# Patient Record
Sex: Male | Born: 1955 | Hispanic: Yes | Marital: Married | Smoking: Former smoker
Health system: Southern US, Community
[De-identification: ages and names within clinical notes are randomized; demographics above are authoritative.]

## PROBLEM LIST (undated history)

## (undated) DIAGNOSIS — K219 Gastro-esophageal reflux disease without esophagitis: Secondary | ICD-10-CM

## (undated) DIAGNOSIS — F419 Anxiety disorder, unspecified: Secondary | ICD-10-CM

## (undated) DIAGNOSIS — R7303 Prediabetes: Secondary | ICD-10-CM

## (undated) DIAGNOSIS — M1990 Unspecified osteoarthritis, unspecified site: Secondary | ICD-10-CM

## (undated) HISTORY — DX: Unspecified osteoarthritis, unspecified site: M19.90

## (undated) HISTORY — DX: Gastro-esophageal reflux disease without esophagitis: K21.9

## (undated) HISTORY — PX: EYE SURGERY: SHX253

## (undated) HISTORY — PX: OTHER SURGICAL HISTORY: SHX169

## (undated) HISTORY — PX: CHOLECYSTECTOMY: SHX55

---

## 2008-07-07 ENCOUNTER — Emergency Department: Payer: Self-pay | Admitting: Emergency Medicine

## 2009-07-31 ENCOUNTER — Emergency Department: Payer: Self-pay | Admitting: Emergency Medicine

## 2009-08-16 ENCOUNTER — Ambulatory Visit: Payer: Self-pay | Admitting: Gastroenterology

## 2009-08-24 ENCOUNTER — Ambulatory Visit: Payer: Self-pay | Admitting: Surgery

## 2009-08-27 ENCOUNTER — Ambulatory Visit: Payer: Self-pay | Admitting: Surgery

## 2017-09-06 ENCOUNTER — Encounter: Payer: Self-pay | Admitting: Primary Care

## 2017-09-07 ENCOUNTER — Encounter: Payer: Self-pay | Admitting: Gastroenterology

## 2017-09-18 ENCOUNTER — Ambulatory Visit (INDEPENDENT_AMBULATORY_CARE_PROVIDER_SITE_OTHER): Payer: Self-pay | Admitting: Urology

## 2017-09-18 ENCOUNTER — Encounter: Payer: Self-pay | Admitting: Urology

## 2017-09-18 VITALS — BP 135/75 | HR 76 | Ht 68.0 in | Wt 165.0 lb

## 2017-09-18 DIAGNOSIS — N400 Enlarged prostate without lower urinary tract symptoms: Secondary | ICD-10-CM

## 2017-09-18 LAB — URINALYSIS, COMPLETE
BILIRUBIN UA: NEGATIVE
GLUCOSE, UA: NEGATIVE
Ketones, UA: NEGATIVE
Leukocytes, UA: NEGATIVE
Nitrite, UA: NEGATIVE
Protein, UA: NEGATIVE
RBC, UA: NEGATIVE
Specific Gravity, UA: 1.02 (ref 1.005–1.030)
UUROB: 0.2 mg/dL (ref 0.2–1.0)
pH, UA: 6 (ref 5.0–7.5)

## 2017-09-18 LAB — BLADDER SCAN AMB NON-IMAGING: Scan Result: 14

## 2017-09-18 NOTE — Progress Notes (Signed)
09/18/2017 12:34 PM   Troy Juarez 14-Feb-1955 [id number]  Referring provider: Sandrea Hughsubio, Jessica, NP [address] [place], [place] [address]  CC: Severe lower urinary tract symptoms  HPI: I am seeing Troy Juarez in urology clinic today in consultation for severe lower urinary tract symptoms. frfrom his nurse practitioner Sandrea HughsJessica Rubio.  He is a healthy 62 year old male who reports a 3 to 4-year history of worsening urinary symptoms.  He specifically reports frequency every 30 to 40 minutes, weak stream, feeling of incomplete emptying, and nocturia 6-8 times per night.  He has tried tamsulosin and finasteride for over 6 months with no improvement at all.  He denies any gross hematuria, urinary tract infections, or history of urinary retention.  He also denies family history of prostate cancer.  The entirety of today's visit was conducted through a Engineer, structuralpanish translator.  IPSS score 24 (severe).  PVR in clinic today is 45 cc  PSA 0.9   PMH: Past Medical History:  Diagnosis Date  . Arthritis   . GERD (gastroesophageal reflux disease)     Surgical History: History reviewed. No pertinent surgical history.  Home Medications:  Allergies as of 09/18/2017   Not on File     Medication List        Accurate as of 09/18/17 12:34 PM. Always use your most recent med list.          finasteride 5 MG tablet Commonly known as:  PROSCAR Take 5 mg by mouth daily.   hydrocortisone 2.5 % rectal cream Commonly known as:  ANUSOL-HC Place 1 application rectally 2 (two) times daily.   omeprazole 20 MG capsule Commonly known as:  PRILOSEC Take 20 mg by mouth daily.   tamsulosin 0.4 MG Caps capsule Commonly known as:  FLOMAX Take 0.4 mg by mouth.       Allergies: Not on File  Family History: Family History  Problem Relation Age of Onset  . Prostate cancer Neg Hx   . Bladder Cancer Neg Hx   . Kidney cancer Neg Hx     Social History:  reports that he has quit  smoking. He has never used smokeless tobacco. He reports that he drinks alcohol. He reports that he has current or past drug history.  ROS: Please see flowsheet from today's date for complete review of systems.  Physical Exam: BP 135/75 (BP Location: Left Arm, Patient Position: Sitting, Cuff Size: Normal)   Pulse 76   Ht 5\' 8"  (1.727 m)   Wt 165 lb (74.8 kg)   BMI 25.09 kg/m   Constitutional:  Alert and oriented, No acute distress. Cardiovascular: No clubbing, cyanosis, or edema. Respiratory: Normal respiratory effort, no increased work of breathing. GI: Abdomen is soft, nontender, nondistended, no abdominal masses GU: No CVA tenderness, uncircumcised phallus without lesions, widely patent meatus. Atrophic left testicle. DRE: ~50g gland, smooth, no nodules Lymph: No cervical or inguinal lymphadenopathy. Skin: No rashes, bruises or suspicious lesions. Neurologic: Grossly intact, no focal deficits, moving all 4 extremities. Psychiatric: Normal mood and affect.  Laboratory Data: 09-06-2017 WBC 7.6 Hct 43 Plt 381 sCr 0.88  PSA 0.9  Urinalysis negative, no RBCS, WBCs, or bacteria  Pertinent Imaging: None to review  Assessment & Plan:   In summary, Troy Juarez is a 62 year old healthy Spanish-speaking male who presents with severe lower urinary tract symptoms refractory to maximal medical management.  His PSA is normal at 0.9, and he has normal renal function with a creatinine of 0.88.  IPSS  score is 24.  We had a long conversation about his lower urinary tract symptoms and BPH.  We specifically discussed that there are multiple surgical options for treatment however the best option can depend on prostate size and shape, as well as patient preference.  We will plan to set him up for a joint cystoscopy and transrectal ultrasound to evaluate prostate size and shape, as well as rule out urethral stricture, in the coming weeks.  Pending the results of the studies we will further discuss  options for bladder outlet management including transurethral resection of the prostate, holmium laser enucleation of the prostate, TUIP, and UroLift.  1. Benign prostatic hyperplasia, unspecified whether lower urinary tract symptoms present -Cystoscopy and transrectal ultrasound of prostate next available, and discuss bladder outlet surgeries   Return in about 2 weeks (around 10/02/2017) for Cystoscopy and transrectal ultrasound.  Sondra ComeBrian C Tawania Daponte, MD  [facility] [address], [address] [place], [place] [address] [phone]

## 2017-09-18 NOTE — Patient Instructions (Signed)
Hiperplasia prosttica benigna Benign Prostatic Hyperplasia La hiperplasia prosttica benigna (HBP) es un aumento del tamao de la prstata que es causado por el proceso de envejecimiento normal y no por el cncer. La prstata es una glndula del tamao de una nuez que participa en la produccin de semen. Est ubicada frente al recto y debajo de la vejiga. La vejiga almacena orina, y la uretra es el tubo que transporta la orina desde la vejiga hasta el exterior del cuerpo. La prstata puede aumentar de tamao a medida que un hombre envejece. Una prstata agrandada puede presionar la uretra. Esto puede dificultar el pasaje de la orina. La acumulacin de orina en la vejiga puede causar una infeccin. La presin y la infeccin pueden provocar daos en la vejiga y una insuficiencia en los riones (renal). Cules son las causas? Esta afeccin es una parte normal del proceso de envejecimiento. Sin embargo, no todos los hombres desarrollan problemas por esta afeccin. Si la prstata se agranda lejos de la uretra, el flujo de orina no se obstruir. Si se agranda hacia la uretra y la comprime, habr problemas con el paso de la orina. Qu incrementa el riesgo? Es ms probable que esta afeccin se manifieste en hombres mayores de 50aos. Cules son los signos o los sntomas? Los sntomas de esta afeccin incluyen los siguientes:  Levantarse con frecuencia durante la noche para orinar.  Necesidad de orinar con ms frecuencia durante el da.  Dificultad para comenzar a eliminar la orina.  Disminucin del tamao y de la fuerza del chorro de orina.  Prdida (goteo) luego de orinar.  Imposibilidad para orinar. En este caso, es necesario un tratamiento inmediato.  Imposibilidad para vaciar la vejiga completamente.  Dolor al orinar. Esto es ms comn si tambin hay una infeccin.  Infeccin urinaria (IU).  Cmo se diagnostica? Esta afeccin se diagnostica en funcin de los antecedentes mdicos, un  examen fsico y los sntomas. Tambin se le realizarn estudios, por ejemplo:  Un estudio despus de vaciar la vejiga. Este mide la cantidad de orina que queda en la vejiga despus de terminar de orinar.  Examen rectal digital. En un examen rectal, el mdico controla la prstata colocando un dedo lubricado y enguantado en el recto para sentir la parte posterior de la prstata. Este examen detecta el tamao de la glndula y si hay algn bulto o crecimiento anormal.  Anlisis de orina (urinlisis).  Estudio de antgeno prosttico especfico (PSA). Este es un anlisis de sangre que se utiliza para diagnosticar el cncer de prstata.  Una ecografa. En este estudio, se utilizan ondas de sonido para producir de manera electrnica una imagen de la glndula prosttica.  El mdico puede derivarlo a un especialista en enfermedades del rin y de la prstata (urlogo). Cmo se trata? Una vez que los sntomas comienzan, el mdico controlar la afeccin (vigilancia activa u observacin cautelosa). El tratamiento depender de la gravedad de la afeccin. El tratamiento puede incluir lo siguiente:  Observacin y exmenes anuales. Este puede ser el nico tratamiento necesario si la afeccin y los sntomas son leves.  Medicamentos para aliviar los sntomas, entre los que se incluyen los siguientes: ? Medicamentos para achicar la prstata. ? Medicamentos para relajar el msculo de la prstata.  Ciruga, solo cuando los casos son graves. La ciruga puede incluir lo siguiente: ? Prostatectoma. En este procedimiento, se extrae el tejido de la prstata completamente a travs de una incisin abierta, con un laparoscopio o con robtica. ? Reseccin transuretral de la prstata (  RTUP). En este procedimiento, se inserta una herramienta a travs de la abertura en la punta del pene (uretra). Se utiliza para cortar tejido del centro interior de la prstata. Los trozos se retiran a travs de la misma abertura del pene.  De este modo, se libera la obstruccin. ? Incisin transuretral (ITUP). En este procedimiento, se hacen pequeos cortes en la prstata. Esto alivia la presin de la prstata sobre la uretra. ? Termoterapia transuretral con microondas (TTUM). En este procedimiento, se utilizan microondas para generar calor. El calor destruye y extirpa una pequea cantidad de tejido prosttico. ? Ablacin transuretral con aguja (ATUA). En este procedimiento, se utiliza la radiofrecuencia para destruir y extirpar una pequea cantidad de tejido prosttico. ? Coagulacin intersticial con lser (CIL). En este procedimiento, se utiliza un lser para destruir y extirpar una pequea cantidad de tejido prosttico. ? Electrovaporizacin transuretral (EVTU). En este procedimiento, se utilizan electrodos para destruir y extirpar una pequea cantidad de tejido prosttico. ? Liberacin uretral prosttica. En este procedimiento, se inserta un implante para ejercer presin en los lbulos de la prstata, en direccin contraria a la uretra.  Siga estas indicaciones en su casa:  Tome los medicamentos de venta libre y los recetados solamente como se lo haya indicado el mdico.  Controle si hay cambios en los sntomas. Hable con su mdico antes de hacer cualquier cambio.  Evite beber grandes cantidades de lquido antes de irse a la cama o de salir de su casa.  Evite o reduzca la cantidad de cafena o alcohol que consume.  Tmese tiempo para orinar.  Concurra a todas las visitas de seguimiento como se lo haya indicado el mdico. Esto es importante. Comunquese con un mdico si:  Siente dolor en la espalda sin explicacin.  Los sntomas no mejoran con el tratamiento.  Los medicamentos le causan efectos secundarios.  La orina se vuelve muy oscura o tiene mal olor.  Siente que la parte inferior del abdomen est distendida y tiene problemas para orinar. Solicite ayuda de inmediato si:  Tiene fiebre o siente escalofros.  De  repente no puede orinar.  Se siente mareado, ligeramente aturdido o se desmaya.  Observa gran cantidad de sangre o cogulos sanguneos en la orina.  Sus problemas urinarios se vuelven difciles de controlar.  Siente dolor moderado a intenso en la espalda o en la fosa lumbar. La fosa lumbar es el costado del cuerpo entre las costillas y la cadera. Estos sntomas pueden representar un problema grave que constituye una emergencia. No espere hasta que los sntomas desaparezcan. Solicite atencin mdica de inmediato. Comunquese con el servicio de emergencias de su localidad (911 en los [place]). No conduzca por sus propios medios hasta el hospital. Resumen  La hiperplasia prosttica benigna (HBP) es un aumento del tamao de la prstata que es causado por el proceso de envejecimiento normal y no por el cncer.  Una prstata agrandada puede presionar la uretra. Esto puede dificultar el paso de la orina.  Esta afeccin es parte del proceso de envejecimiento normal y es ms probable que se manifieste en hombres mayores de 50aos.  Obtenga atencin de inmediato si de repente no puede orinar. Esta informacin no tiene como fin reemplazar el consejo del mdico. Asegrese de hacerle al mdico cualquier pregunta que tenga. Document Released: 01/23/2005 Document Revised: 05/15/2016 Document Reviewed: 05/15/2016 Elsevier Interactive Patient Education  2018 Elsevier Inc.  

## 2017-10-02 ENCOUNTER — Ambulatory Visit (INDEPENDENT_AMBULATORY_CARE_PROVIDER_SITE_OTHER): Payer: No Typology Code available for payment source | Admitting: Urology

## 2017-10-02 ENCOUNTER — Encounter: Payer: Self-pay | Admitting: Urology

## 2017-10-02 VITALS — BP 129/69 | HR 62 | Ht 68.0 in | Wt 172.3 lb

## 2017-10-02 DIAGNOSIS — N400 Enlarged prostate without lower urinary tract symptoms: Secondary | ICD-10-CM | POA: Diagnosis not present

## 2017-10-02 LAB — URINALYSIS, COMPLETE
Bilirubin, UA: NEGATIVE
Glucose, UA: NEGATIVE
Ketones, UA: NEGATIVE
Leukocytes, UA: NEGATIVE
Nitrite, UA: NEGATIVE
RBC UA: NEGATIVE
Specific Gravity, UA: 1.02 (ref 1.005–1.030)
UUROB: 0.2 mg/dL (ref 0.2–1.0)
pH, UA: 8.5 — ABNORMAL HIGH (ref 5.0–7.5)

## 2017-10-02 LAB — MICROSCOPIC EXAMINATION
BACTERIA UA: NONE SEEN
Epithelial Cells (non renal): NONE SEEN /hpf (ref 0–10)
RBC MICROSCOPIC, UA: NONE SEEN /HPF (ref 0–2)
WBC, UA: NONE SEEN /hpf (ref 0–5)

## 2017-10-02 MED ORDER — LEVOFLOXACIN 500 MG PO TABS
500.0000 mg | ORAL_TABLET | Freq: Once | ORAL | Status: AC
  Administered 2017-10-02: 500 mg via ORAL

## 2017-10-02 MED ORDER — OXYBUTYNIN CHLORIDE ER 10 MG PO TB24: 10.0000 mg | ORAL_TABLET | Freq: Every day | ORAL | 2 refills | Status: DC

## 2017-10-02 MED ORDER — GENTAMICIN SULFATE 40 MG/ML IJ SOLN
80.0000 mg | Freq: Once | INTRAMUSCULAR | Status: AC
  Administered 2017-10-02: 80 mg via INTRAMUSCULAR

## 2017-10-02 NOTE — Progress Notes (Signed)
Cystoscopy Procedure Note:  Indication: Evaluate for stricture/prostate anatomy  After informed consent and discussion of the procedure and its risks, Troy Juarez was positioned and prepped in the standard fashion. Cystoscopy was performed with the a flexible cystoscope. The urethra, bladder neck and entire bladder was visualized in a standard fashion. The prostate was short with a high bladder neck. No median lobe on retroflexion.  Transrectal ultrasound: Ultrasound probe inserted into the rectum, prostate visualized. No hypoechoic areas. No median lobe. Decompressed bladder. Volume 32cc.   Findings: 32cc prostate with high bladder neck, no median lobe. No urethral abnormalities.  Assessment and Plan: We discussed at length BPH and his urinary symptoms.  Though he has mixed symptoms, I suspect his primary issue is a high bladder neck with obstruction.  He would like to proceed with transurethral resection of the prostate. We discussed the risks and benefits, including bleeding, infection, and retrograde ejaculation.  We will trial a month of Ditropan for possible overactive bladder.  -Schedule bipolar TURP in 4-6 weeks -Trial of ditropan XL, proceed with TURP if no improvement in symptoms, cancel surgery if urinary symptoms significantly improved on ditropan  Legrand RamsBrian Sninsky, MD

## 2017-10-05 ENCOUNTER — Other Ambulatory Visit: Payer: Self-pay | Admitting: Radiology

## 2017-10-05 DIAGNOSIS — N400 Enlarged prostate without lower urinary tract symptoms: Secondary | ICD-10-CM

## 2017-10-09 ENCOUNTER — Telehealth: Payer: Self-pay | Admitting: Radiology

## 2017-10-09 NOTE — Telephone Encounter (Signed)
Daughter states patient has been taking ditropan XL for one week with no improvement in symptoms at this time. Advised daughter that surgical clearance is required from patient's PCP & they will contact patient if an appointment with them is needed prior to clearance. Questions answered. Daughter voices understanding.

## 2017-10-30 ENCOUNTER — Other Ambulatory Visit: Payer: Self-pay

## 2017-11-02 ENCOUNTER — Other Ambulatory Visit: Payer: Self-pay

## 2017-11-02 ENCOUNTER — Encounter
Admission: RE | Admit: 2017-11-02 | Discharge: 2017-11-02 | Disposition: A | Discharge status: Home / Self Care | Payer: PRIVATE HEALTH INSURANCE | Source: Ambulatory Visit | Attending: Urology | Admitting: Urology

## 2017-11-02 DIAGNOSIS — E119 Type 2 diabetes mellitus without complications: Secondary | ICD-10-CM | POA: Diagnosis not present

## 2017-11-02 DIAGNOSIS — Z01810 Encounter for preprocedural cardiovascular examination: Secondary | ICD-10-CM | POA: Diagnosis present

## 2017-11-02 DIAGNOSIS — Z01812 Encounter for preprocedural laboratory examination: Secondary | ICD-10-CM | POA: Diagnosis present

## 2017-11-02 HISTORY — DX: Prediabetes: R73.03

## 2017-11-02 HISTORY — DX: Anxiety disorder, unspecified: F41.9

## 2017-11-02 LAB — CBC
HEMATOCRIT: 42.7 % (ref 40.0–52.0)
Hemoglobin: 15.1 g/dL (ref 13.0–18.0)
MCH: 32.8 pg (ref 26.0–34.0)
MCHC: 35.2 g/dL (ref 32.0–36.0)
MCV: 93.1 fL (ref 80.0–100.0)
PLATELETS: 311 10*3/uL (ref 150–440)
RBC: 4.59 MIL/uL (ref 4.40–5.90)
RDW: 13.5 % (ref 11.5–14.5)
WBC: 7.7 10*3/uL (ref 3.8–10.6)

## 2017-11-02 LAB — BASIC METABOLIC PANEL
Anion gap: 8 (ref 5–15)
BUN: 18 mg/dL (ref 8–23)
CHLORIDE: 106 mmol/L (ref 98–111)
CO2: 26 mmol/L (ref 22–32)
CREATININE: 0.88 mg/dL (ref 0.61–1.24)
Calcium: 9.2 mg/dL (ref 8.9–10.3)
GFR calc Af Amer: >60 mL/min (ref >60)
GFR calc non Af Amer: >60 mL/min (ref >60)
Glucose, Bld: 123 mg/dL — ABNORMAL HIGH (ref 70–99)
POTASSIUM: 4 mmol/L (ref 3.5–5.1)
SODIUM: 140 mmol/L (ref 135–145)

## 2017-11-02 NOTE — Patient Instructions (Addendum)
Your procedure is scheduled on: 11/09/2017 Su procedimiento est programado para: Report to 2ND FLOOR MEDICAL MALL ENTRANCE SURGERY DESK Augustin Coupe a: To find out your arrival time please call [phone] between 1PM - 3PM on 11/08/2017. Para saber su hora de llegada por favor llame al [phone] entre la 1PM - 3PM el da:  Remember: Instructions that are not followed completely may result in serious medical risk, up to and including death, or upon the discretion of your surgeon and anesthesiologist your surgery may need to be rescheduled.  Recuerde: Las instrucciones que no se siguen completamente Armed forces logistics/support/administrative officer en un riesgo de salud grave, incluyendo hasta la [place] o a discrecin de su cirujano y Scientific laboratory technician, su ciruga se puede posponer.   __X__ 1. Do not eat food or drink liquids after midnight. No gum chewing or hard candies.  No coma alimentos ni tome lquidos despus de la medianoche.  No mastique chicle ni caramelos  duros.     __X__ 2. No alcohol for 24 hours before or after surgery.    No tome alcohol durante las 24 horas antes ni despus de la [place].   ____ 3. Bring all medications with you on the day of surgery if instructed.    Lleve todos los medicamentos con usted el da de su ciruga si se le ha indicado as.   __X__ 4. Notify your doctor if there is any change in your medical condition (cold, fever,                             infections).    Informe a su mdico si hay algn cambio en su condicin mdica (resfriado, fiebre, infecciones).   Do not wear jewelry, make-up, hairpins, clips or nail polish.  No use joyas, maquillajes, pinzas/ganchos para el cabello ni esmalte de uas.  Do not wear lotions, powders, or perfumes. .  No use lociones, polvos o perfumes.  .    Do not shave 48 hours prior to surgery. Men may shave face and neck.  No se afeite 48 horas antes de la [place].  Los hombres pueden [employer] cara y el cuello.   Do not bring valuables to the  hospital.   No lleve objetos de valor al hospital.  [facility] is not responsible for any belongings or valuables.   no se hace responsable de ningn tipo de pertenencias u objetos de Licensed conveyancer.               Contacts, dentures or bridgework may not be worn into surgery.  Los lentes de [place], las dentaduras postizas o puentes no se pueden usar en la [place].  Leave your suitcase in the car. After surgery it may be brought to your room.  Deje su maleta en el auto.  Despus de la ciruga podr traerla a su habitacin.  For patients admitted to the hospital, discharge time is determined by your treatment team.  Para los pacientes que sean ingresados al hospital, el tiempo en el cual se le dar de alta es determinado por su                equipo de [place].   Patients discharged the day of surgery will not be allowed to drive home. A los pacientes que se les da de alta el mismo da de la ciruga no se les permitir conducir a Higher education careers adviser.   Please read over the following fact sheets that you were  given: Por favor lea las siguientes hojas de informacin que le dieron:   MRSA Information   __X__ Take these medicines the morning of surgery with A SIP OF WATER:          [employer] medicinas la maana de la ciruga con UN SORBO DE AGUA:  1. omeprazole (PRILOSEC)  2.   3.   4.       5.  6.  ____ Fleet Enema (as directed)          Enema de Fleet (segn lo indicado)    ____ Use CHG Soap as directed          Utilice el jabn de CHG segn lo indicado  ____ Use inhalers on the day of surgery          Use los inhaladores el da de la ciruga  ____ Stop metformin 2 days prior to surgery          Deje de tomar el metformin 2 das antes de la ciruga    ____ Take 1/2 of usual insulin dose the night before surgery and none on the morning of surgery           Tome la mitad de la dosis habitual de insulina la noche antes de la [place] y no tome nada en la maana de la              ciruga  ____ Stop Coumadin/Plavix/aspirin on           Deje de tomar el Coumadin/Plavix/aspirina el da:  __X__ Stop Anti-inflammatories on TODAY NO IBUPROFEN UNTIL AFTER SURGERY MAY TAKE TYLENOL IF NEEDED          Deje de tomar antiinflamatorios el da:   ____ Stop supplements until after surgery            Deje de tomar suplementos hasta despus de la ciruga  ____ Bring C-Pap to the hospital          Lleve el C-Pap al hospital

## 2017-11-08 MED ORDER — CIPROFLOXACIN IN D5W 400 MG/200ML IV SOLN
400.0000 mg | INTRAVENOUS | Status: AC
  Administered 2017-11-09: 400 mg via INTRAVENOUS

## 2017-11-09 ENCOUNTER — Ambulatory Visit: Payer: PRIVATE HEALTH INSURANCE | Admitting: Family

## 2017-11-09 ENCOUNTER — Observation Stay
Admission: RE | Admit: 2017-11-09 | Discharge: 2017-11-10 | Disposition: A | Discharge status: Home / Self Care | Payer: PRIVATE HEALTH INSURANCE | Source: Ambulatory Visit | Attending: Urology | Admitting: Urology

## 2017-11-09 ENCOUNTER — Encounter
Admission: RE | Disposition: A | Discharge status: Home / Self Care | Payer: Self-pay | Source: Ambulatory Visit | Attending: Urology

## 2017-11-09 ENCOUNTER — Other Ambulatory Visit: Payer: Self-pay

## 2017-11-09 DIAGNOSIS — R3911 Hesitancy of micturition: Secondary | ICD-10-CM | POA: Diagnosis not present

## 2017-11-09 DIAGNOSIS — N400 Enlarged prostate without lower urinary tract symptoms: Secondary | ICD-10-CM | POA: Diagnosis present

## 2017-11-09 DIAGNOSIS — Z23 Encounter for immunization: Secondary | ICD-10-CM | POA: Insufficient documentation

## 2017-11-09 DIAGNOSIS — N401 Enlarged prostate with lower urinary tract symptoms: Principal | ICD-10-CM | POA: Insufficient documentation

## 2017-11-09 DIAGNOSIS — M1990 Unspecified osteoarthritis, unspecified site: Secondary | ICD-10-CM | POA: Insufficient documentation

## 2017-11-09 DIAGNOSIS — F419 Anxiety disorder, unspecified: Secondary | ICD-10-CM | POA: Diagnosis not present

## 2017-11-09 DIAGNOSIS — R7303 Prediabetes: Secondary | ICD-10-CM | POA: Insufficient documentation

## 2017-11-09 DIAGNOSIS — K219 Gastro-esophageal reflux disease without esophagitis: Secondary | ICD-10-CM | POA: Diagnosis not present

## 2017-11-09 DIAGNOSIS — R350 Frequency of micturition: Secondary | ICD-10-CM | POA: Diagnosis not present

## 2017-11-09 DIAGNOSIS — Z87891 Personal history of nicotine dependence: Secondary | ICD-10-CM | POA: Diagnosis not present

## 2017-11-09 DIAGNOSIS — R3915 Urgency of urination: Secondary | ICD-10-CM | POA: Insufficient documentation

## 2017-11-09 DIAGNOSIS — R3912 Poor urinary stream: Secondary | ICD-10-CM | POA: Diagnosis not present

## 2017-11-09 DIAGNOSIS — Z79899 Other long term (current) drug therapy: Secondary | ICD-10-CM | POA: Diagnosis not present

## 2017-11-09 HISTORY — PX: TRANSURETHRAL RESECTION OF PROSTATE: SHX73

## 2017-11-09 SURGERY — TURP (TRANSURETHRAL RESECTION OF PROSTATE)
Anesthesia: General | Site: Prostate

## 2017-11-09 MED ORDER — FAMOTIDINE 20 MG PO TABS
ORAL_TABLET | ORAL | Status: AC
  Administered 2017-11-09: 20 mg
  Filled 2017-11-09: qty 1

## 2017-11-09 MED ORDER — SUGAMMADEX SODIUM 200 MG/2ML IV SOLN
INTRAVENOUS | Status: DC | PRN
  Administered 2017-11-09: 100 mg via INTRAVENOUS

## 2017-11-09 MED ORDER — PANTOPRAZOLE SODIUM 40 MG PO TBEC
40.0000 mg | DELAYED_RELEASE_TABLET | Freq: Every day | ORAL | Status: DC
  Administered 2017-11-09 – 2017-11-10 (×2): 40 mg via ORAL
  Filled 2017-11-09 (×2): qty 1

## 2017-11-09 MED ORDER — ONDANSETRON HCL 4 MG/2ML IJ SOLN: 4.0000 mg | INTRAMUSCULAR | Status: DC | PRN

## 2017-11-09 MED ORDER — MIDAZOLAM HCL 2 MG/2ML IJ SOLN
INTRAMUSCULAR | Status: AC
  Filled 2017-11-09: qty 2

## 2017-11-09 MED ORDER — PROMETHAZINE HCL 25 MG/ML IJ SOLN: 6.2500 mg | INTRAMUSCULAR | Status: DC | PRN

## 2017-11-09 MED ORDER — HYDROCODONE-ACETAMINOPHEN 5-325 MG PO TABS: 1.0000 | ORAL_TABLET | ORAL | 0 refills | Status: AC | PRN

## 2017-11-09 MED ORDER — PROPOFOL 10 MG/ML IV BOLUS
INTRAVENOUS | Status: DC | PRN
  Administered 2017-11-09: 130 mg via INTRAVENOUS

## 2017-11-09 MED ORDER — SENNOSIDES-DOCUSATE SODIUM 8.6-50 MG PO TABS
2.0000 | ORAL_TABLET | Freq: Every day | ORAL | Status: DC
  Administered 2017-11-09: 2 via ORAL
  Filled 2017-11-09: qty 2

## 2017-11-09 MED ORDER — LIDOCAINE HCL (CARDIAC) PF 100 MG/5ML IV SOSY
PREFILLED_SYRINGE | INTRAVENOUS | Status: DC | PRN
  Administered 2017-11-09: 30 mg via INTRAVENOUS

## 2017-11-09 MED ORDER — HYDROMORPHONE HCL 1 MG/ML IJ SOLN: 0.5000 mg | INTRAMUSCULAR | Status: DC | PRN

## 2017-11-09 MED ORDER — LACTATED RINGERS IV SOLN
INTRAVENOUS | Status: DC
  Administered 2017-11-09: 12:00:00 via INTRAVENOUS

## 2017-11-09 MED ORDER — INFLUENZA VAC SPLIT QUAD 0.5 ML IM SUSY
0.5000 mL | PREFILLED_SYRINGE | INTRAMUSCULAR | Status: AC
  Administered 2017-11-10: 0.5 mL via INTRAMUSCULAR
  Filled 2017-11-09: qty 0.5

## 2017-11-09 MED ORDER — DEXAMETHASONE SODIUM PHOSPHATE 10 MG/ML IJ SOLN
INTRAMUSCULAR | Status: DC | PRN
  Administered 2017-11-09: 10 mg via INTRAVENOUS

## 2017-11-09 MED ORDER — OXYCODONE HCL 5 MG PO TABS: 5.0000 mg | ORAL_TABLET | Freq: Once | ORAL | Status: DC | PRN

## 2017-11-09 MED ORDER — ROCURONIUM BROMIDE 100 MG/10ML IV SOLN
INTRAVENOUS | Status: DC | PRN
  Administered 2017-11-09: 50 mg via INTRAVENOUS

## 2017-11-09 MED ORDER — HYDROCORTISONE 2.5 % RE CREA
1.0000 "application " | TOPICAL_CREAM | Freq: Two times a day (BID) | RECTAL | Status: DC
  Administered 2017-11-09 – 2017-11-10 (×2): 1 via RECTAL
  Filled 2017-11-09: qty 28.35

## 2017-11-09 MED ORDER — CIPROFLOXACIN IN D5W 400 MG/200ML IV SOLN
INTRAVENOUS | Status: AC
  Filled 2017-11-09: qty 200

## 2017-11-09 MED ORDER — ACETAMINOPHEN 325 MG PO TABS: 650.0000 mg | ORAL_TABLET | ORAL | Status: DC | PRN

## 2017-11-09 MED ORDER — FENTANYL CITRATE (PF) 100 MCG/2ML IJ SOLN: 25.0000 ug | INTRAMUSCULAR | Status: DC | PRN

## 2017-11-09 MED ORDER — HYDROCODONE-ACETAMINOPHEN 5-325 MG PO TABS
1.0000 | ORAL_TABLET | ORAL | Status: DC | PRN
  Administered 2017-11-09: 1 via ORAL
  Administered 2017-11-09: 2 via ORAL
  Administered 2017-11-10: 1 via ORAL
  Filled 2017-11-09: qty 2
  Filled 2017-11-09 (×2): qty 1

## 2017-11-09 MED ORDER — PROPOFOL 500 MG/50ML IV EMUL
INTRAVENOUS | Status: AC
  Filled 2017-11-09: qty 100

## 2017-11-09 MED ORDER — FENTANYL CITRATE (PF) 100 MCG/2ML IJ SOLN
INTRAMUSCULAR | Status: AC
  Filled 2017-11-09: qty 2

## 2017-11-09 MED ORDER — MEPERIDINE HCL 50 MG/ML IJ SOLN: 6.2500 mg | INTRAMUSCULAR | Status: DC | PRN

## 2017-11-09 MED ORDER — OXYCODONE HCL 5 MG/5ML PO SOLN: 5.0000 mg | Freq: Once | ORAL | Status: DC | PRN

## 2017-11-09 MED ORDER — SODIUM CHLORIDE 0.9 % IR SOLN: 3000.0000 mL | Status: DC

## 2017-11-09 MED ORDER — MIDAZOLAM HCL 2 MG/2ML IJ SOLN
INTRAMUSCULAR | Status: DC | PRN
  Administered 2017-11-09: 2 mg via INTRAVENOUS

## 2017-11-09 MED ORDER — BELLADONNA ALKALOIDS-OPIUM 16.2-60 MG RE SUPP: 1.0000 | Freq: Four times a day (QID) | RECTAL | Status: DC | PRN

## 2017-11-09 SURGICAL SUPPLY — 21 items
ADAPTER IRRIG TUBE 2 SPIKE SOL (ADAPTER) IMPLANT
BAG DRAIN CYSTO-URO LG1000N (MISCELLANEOUS) ×2 IMPLANT
BAG URO DRAIN 4000ML (MISCELLANEOUS) ×2 IMPLANT
CATH FOLEY 3WAY 30CC 22FR (CATHETERS) ×2 IMPLANT
DRAPE UTILITY 15X26 TOWEL STRL (DRAPES) ×2 IMPLANT
ELECT LOOP 22F BIPOLAR SML (ELECTROSURGICAL)
ELECTRODE LOOP 22F BIPOLAR SML (ELECTROSURGICAL) IMPLANT
GLOVE BIO SURGEON STRL SZ7.5 (GLOVE) ×2 IMPLANT
GOWN STRL REUS W/ TWL LRG LVL3 (GOWN DISPOSABLE) ×1 IMPLANT
GOWN STRL REUS W/ TWL XL LVL3 (GOWN DISPOSABLE) ×1 IMPLANT
GOWN STRL REUS W/TWL LRG LVL3 (GOWN DISPOSABLE) ×1
GOWN STRL REUS W/TWL XL LVL3 (GOWN DISPOSABLE) ×1
HOLDER FOLEY CATH W/STRAP (MISCELLANEOUS) ×4 IMPLANT
KIT TURNOVER CYSTO (KITS) ×2 IMPLANT
LOOP CUT BIPOLAR 24F LRG (ELECTROSURGICAL) ×2 IMPLANT
PACK CYSTO AR (MISCELLANEOUS) ×2 IMPLANT
SET IRRIG Y TYPE TUR BLADDER L (SET/KITS/TRAYS/PACK) ×2 IMPLANT
SOL .9 NS 3000ML IRR  AL (IV SOLUTION) ×6
SOL .9 NS 3000ML IRR UROMATIC (IV SOLUTION) ×6 IMPLANT
SYRINGE IRR TOOMEY STRL 70CC (SYRINGE) ×2 IMPLANT
WATER STERILE IRR 1000ML POUR (IV SOLUTION) ×2 IMPLANT

## 2017-11-09 NOTE — Anesthesia Postprocedure Evaluation (Signed)
Anesthesia Post Note  Patient: Troy Juarez  Procedure(s) Performed: Bipolar TRANSURETHRAL RESECTION OF THE PROSTATE (TURP) (N/A Prostate)  Patient location during evaluation: PACU Anesthesia Type: General Level of consciousness: awake and alert and oriented Pain management: pain level controlled Vital Signs Assessment: post-procedure vital signs reviewed and stable Respiratory status: spontaneous breathing, nonlabored ventilation and respiratory function stable Cardiovascular status: blood pressure returned to baseline and stable Postop Assessment: no signs of nausea or vomiting Anesthetic complications: no     Last Vitals:  Vitals:   11/09/17 1453 11/09/17 1508  BP: 131/86 126/72  Pulse: 82 68  Resp: 17 14  Temp:    SpO2: 93% 93%    Last Pain:  Vitals:   11/09/17 1508  TempSrc:   PainSc: 0-No pain                 Annye Forrey

## 2017-11-09 NOTE — Anesthesia Preprocedure Evaluation (Signed)
Anesthesia Evaluation  Patient identified by MRN, date of birth, ID band Patient awake    Reviewed: Allergy & Precautions, NPO status , Patient's Chart, lab work & pertinent test results  History of Anesthesia Complications Negative for: history of anesthetic complications  Airway Mallampati: III  TM Distance: >3 FB Neck ROM: Full    Dental  (+) Poor Dentition   Pulmonary neg sleep apnea, neg COPD, former smoker,    breath sounds clear to auscultation- rhonchi (-) wheezing      Cardiovascular Exercise Tolerance: Good (-) hypertension(-) CAD, (-) Past MI, (-) Cardiac Stents and (-) CABG  Rhythm:Regular Rate:Normal - Systolic murmurs and - Diastolic murmurs    Neuro/Psych Anxiety negative neurological ROS     GI/Hepatic Neg liver ROS, GERD  ,  Endo/Other  negative endocrine ROSneg diabetes  Renal/GU negative Renal ROS     Musculoskeletal  (+) Arthritis ,   Abdominal (+) - obese,   Peds  Hematology negative hematology ROS (+)   Anesthesia Other Findings Past Medical History: No date: Anxiety No date: Arthritis No date: GERD (gastroesophageal reflux disease) No date: Pre-diabetes   Reproductive/Obstetrics                             Anesthesia Physical Anesthesia Plan  ASA: II  Anesthesia Plan: General   Post-op Pain Management:    Induction: Intravenous  PONV Risk Score and Plan: 1 and Ondansetron, Dexamethasone and Midazolam  Airway Management Planned: Oral ETT  Additional Equipment:   Intra-op Plan:   Post-operative Plan: Extubation in OR  Informed Consent: I have reviewed the patients History and Physical, chart, labs and discussed the procedure including the risks, benefits and alternatives for the proposed anesthesia with the patient or authorized representative who has indicated his/her understanding and acceptance.   Dental advisory given (consent obtained with  interpreter)  Plan Discussed with: CRNA and Anesthesiologist  Anesthesia Plan Comments:         Anesthesia Quick Evaluation

## 2017-11-09 NOTE — Transfer of Care (Signed)
Immediate Anesthesia Transfer of Care Note  Patient: Troy Juarez  Procedure(s) Performed: Bipolar TRANSURETHRAL RESECTION OF THE PROSTATE (TURP) (N/A Prostate)  Patient Location: PACU  Anesthesia Type:General  Level of Consciousness: awake, alert  and oriented  Airway & Oxygen Therapy: Patient Spontanous Breathing and Patient connected to face mask oxygen  Post-op Assessment: Report given to RN and Post -op Vital signs reviewed and stable  Post vital signs: Reviewed and stable  Last Vitals:  Vitals Value Taken Time  BP 132/83 11/09/2017  2:38 PM  Temp 36.4 C 11/09/2017  2:38 PM  Pulse 73 11/09/2017  2:40 PM  Resp 15 11/09/2017  2:40 PM  SpO2 100 % 11/09/2017  2:40 PM  Vitals shown include unvalidated device data.  Last Pain:  Vitals:   11/09/17 1140  TempSrc: Oral  PainSc: 0-No pain         Complications: No apparent anesthesia complications

## 2017-11-09 NOTE — Anesthesia Post-op Follow-up Note (Signed)
Anesthesia QCDR form completed.        

## 2017-11-09 NOTE — Op Note (Signed)
Date of procedure: 11/09/17  Preoperative diagnosis:  1. BPH with urinary urgency, frequency, and weak stream  Postoperative diagnosis:  1. Same  Procedure: 1. Bipolar transurethral resection of the prostate  Surgeon: Legrand Rams, MD  Anesthesia: General  Complications: None  Intraoperative findings:  1.  Short, small prostate with high bladder neck 2.  Ureteral orifices orthotopic, mild bladder trabeculations  EBL: Minimal  Specimens: Prostate chips  Drains: 22 French three-way Foley  Indication: Troy Juarez is a 62 y.o. patient with severe urinary symptoms including urinary urgency, frequency, and weak stream.  These were refractory to anticholinergic medications, and he elected to undergo TURP.  After reviewing the management options for treatment, they elected to proceed with the above surgical procedure(s). We have discussed the potential benefits and risks of the procedure, side effects of the proposed treatment, the likelihood of the patient achieving the goals of the procedure, and any potential problems that might occur during the procedure or recuperation. Informed consent has been obtained.  Description of procedure:  The patient was taken to the operating room and general anesthesia was induced.  The patient was placed in the dorsal lithotomy position, prepped and draped in the usual sterile fashion, and preoperative antibiotics(Cipro) were administered. A preoperative time-out was performed.   A 26 French resectoscope with a visual obturator was used to enter the bladder.  The urethra was grossly normal.  The prostate was short and small with a very high bladder neck.  The bladder itself was grossly normal with only mild trabeculations.  The ureteral orifices were orthotopic.  There were no stones or other lesions.  Using the large resecting loop we took down the high bladder neck and made a channel from both ureteral orifices down to the verumontanum  flattening the bladder neck.  Obstructing prostate tissue was resected circumferentially.  At no point did the resection proceed distal to the verumontanum.  We resected down to capsule.  Thorough hemostasis was achieved using the rollerball.  All chips were evacuated from the bladder.  Thorough cystoscopy demonstrated no injury to the ureteral orifice ease or the verumontanum.  A 22 French three-way Foley passed easily into the bladder with return of clear fluid.  20 cc was placed in the balloon.  Disposition: Stable to PACU  Plan: For trial tomorrow morning, follow-up in clinic in 4 to 6 weeks for uroflow and PVR.  Legrand Rams, MD

## 2017-11-09 NOTE — H&P (Signed)
   1:06 PM   Troy Juarez Sep 28, 1955 [id number]   HPI: 62 yo M with severe voiding symptoms and weak stream, has failed medical management. 62cc gland, would like to proceed with TURP. No fever/chills, chest pain, SOB.   PMH: Past Medical History:  Diagnosis Date  . Anxiety   . Arthritis   . GERD (gastroesophageal reflux disease)   . Pre-diabetes     Surgical History: Past Surgical History:  Procedure Laterality Date  . CHOLECYSTECTOMY    . EYE SURGERY    . OTHER SURGICAL HISTORY     gallbladder     Allergies: No Known Allergies  Family History: Family History  Problem Relation Age of Onset  . Prostate cancer Neg Hx   . Bladder Cancer Neg Hx   . Kidney cancer Neg Hx     Social History:  reports that he has quit smoking. He has never used smokeless tobacco. He reports that he drank alcohol. He reports that he has current or past drug history.  Physical Exam: BP (!) 155/89   Pulse 64   Temp 98.1 F (36.7 C) (Oral)   Resp 16   Wt 76.2 kg   SpO2 99%   BMI 25.54 kg/m    Constitutional:  Alert and oriented, No acute distress. Cardiovascular: RRR Respiratory: CTA bilaterally GI: Abdomen is soft, nontender, nondistended, no abdominal masses Lymph: No cervical or inguinal lymphadenopathy. Skin: No rashes, bruises or suspicious lesions. Neurologic: Grossly intact, no focal deficits, moving all 4 extremities. Psychiatric: Normal mood and affect.  Laboratory Data: Urinalysis negative 10/02/2017   Assessment & Plan:   62 yo M here today for TURP. Informed consent obtained, discussed risks of bleeding, infection, temporary foley, temporary urgency and urge incontinence, and need for additional procedures.   Sondra Come, MD  [facility] [address], [address] [place], [place] [address] [phone]

## 2017-11-09 NOTE — Anesthesia Procedure Notes (Signed)
Procedure Name: Intubation Date/Time: 11/09/2017 1:41 PM Performed by: Manning Charity, CRNA Pre-anesthesia Checklist: Patient identified, Emergency Drugs available, Suction available and Patient being monitored Patient Re-evaluated:Patient Re-evaluated prior to induction Oxygen Delivery Method: Circle system utilized Preoxygenation: Pre-oxygenation with 100% oxygen Induction Type: IV induction Ventilation: Mask ventilation without difficulty Laryngoscope Size: Robertshaw and 5 Grade View: Grade II Tube type: Oral Number of attempts: 2 Airway Equipment and Method: Stylet Placement Confirmation: ETT inserted through vocal cords under direct vision,  positive ETCO2 and breath sounds checked- equal and bilateral Secured at: 23 cm Tube secured with: Tape

## 2017-11-10 ENCOUNTER — Encounter: Payer: Self-pay | Admitting: Urology

## 2017-11-10 DIAGNOSIS — N401 Enlarged prostate with lower urinary tract symptoms: Secondary | ICD-10-CM | POA: Diagnosis not present

## 2017-11-10 NOTE — Progress Notes (Signed)
Discharge teaching given to patient and his son by spanish speaking nurse provider Trula Slade, RN. Patient verbalized understanding and had no questions. Patient IV removed. Patient will be transported home by family. All patient belongings gathered prior to leaving.

## 2017-11-10 NOTE — Discharge Instructions (Signed)
Transurethral Resection of the Prostate, Care After °Refer to this sheet in the next few weeks. These instructions provide you with information about caring for yourself after your procedure. Your health care provider may also give you more specific instructions. Your treatment has been planned according to current medical practices, but problems sometimes occur. Call your health care provider if you have any problems or questions after your procedure. °What can I expect after the procedure? °After the procedure, it is common to have: °· Mild pain in your lower abdomen. °· Soreness or mild discomfort in your penis from having the catheter inserted during the procedure. °· A feeling of urgency when you need to urinate. °· A small amount of blood in your urine. You may notice some small blood clots in your urine. These are normal. ° °Follow these instructions at home: °Medicines ° °· Take over-the-counter and prescription medicines only as told by your health care provider. °· Do not drive or operate heavy machinery while taking prescription pain medicine. °· Do not drive for 24 hours if you received a sedative. °· If you were prescribed antibiotic medicine, take it as told by your health care provider. Do not stop taking the antibiotic even if you start to feel better. °Activity °· Return to your normal activities as told by your health care provider. Ask your health care provider what activities are safe for you. °· Do not lift anything that is heavier than 10 lb (4.5 kg) for 3 weeks after your procedure, or as long as told by your health care provider. °· Avoid intense physical activity for as long as told by your health care provider. °· Walk at least one time every day. This helps to prevent blood clots. You may increase your physical activity gradually as you start to feel better. °Lifestyle °· Do not drink alcohol for as long as told by your health care provider. This is especially important if you are taking  prescription pain medicines. °· Do not engage in sexual activity until your health care provider says that you can do this. °General instructions °· Do not take baths, swim, or use a hot tub until your health care provider approves. °· Drink enough fluid to keep your urine clear or pale yellow. °· Urinate as soon as you feel the need to. Do not try to hold your urine for long periods of time. °· If your health care provider approves, you may take a stool softener for 2-3 weeks to prevent you from straining to have a bowel movement. °· Wear compression stockings as told by your health care provider. These stockings help to prevent blood clots and reduce swelling in your legs. °· Keep all follow-up visits as told by your health care provider. This is important. °Contact a health care provider if: °· You have difficulty urinating. °· You have a fever. °· You have pain that gets worse or does not improve with medicine. °· You have blood in your urine that does not go away after 1 week of resting and drinking more fluids. °· You have swelling in your penis or testicles. °Get help right away if: °· You are unable to urinate. °· You are having more blood clots in your urine instead of fewer. °· You have: °? Large blood clots. °? A lot of blood in your urine. °? Pain in your back or lower abdomen. °? Pain or swelling in your legs. °? Chills and you are shaking. °This information is not intended to   replace advice given to you by your health care provider. Make sure you discuss any questions you have with your health care provider. °Document Released: 01/23/2005 Document Revised: 09/26/2015 Document Reviewed: 10/15/2014 °Elsevier Interactive Patient Education © 2017 [employer]vier Inc. ° °

## 2017-11-10 NOTE — Discharge Summary (Signed)
Physician Discharge Summary  Patient ID: Troy Juarez MRN: [id number] DOB/AGE: March 26, 1955 62 y.o.  Admit date: 11/09/2017 Discharge date: 11/10/2017  Admission Diagnoses: BPH with lower urinary tract symptoms, urinary hesitancy  Discharge Diagnoses:  Active Problems:   BPH (benign prostatic hyperplasia)   Discharged Condition: good  Hospital Course: Patient was admitted following transurethral resection of the prostate.  He did well with CBI mostly clear overnight.  CBI was discontinued and Foley removed on postoperative day #1.  Patient did well with ambulation and voiding.  He also ate [employer] for breakfast and had no issues.  He is ready for discharge.  Consults: None  Significant Diagnostic Studies: None  Treatments: surgery: Transurethral resection of prostate  Discharge Exam: Blood pressure 123/79, pulse 71, temperature 97.7 F (36.5 C), temperature source Oral, resp. rate 18, height 5\' 8"  (1.727 m), weight 76 kg, SpO2 96 %. No acute distress Sitting in bed Talking with his family Cardiovascular-regular rate and rhythm Respiratory-regular effort and depth Abdomen-soft and nontender Extremities- no lower extremity pain or swelling  Urine in urinal on counter light red, no clots  Disposition: Discharge disposition: 01-Home or Self Care       Discharge Instructions    Discharge instructions   Complete by:  As directed    Drink plenty of fluids to flush the bladder.  It is normal to have blood in the urine.  Call the urology clinic or present to the ER if you have fever over 101, are passing large blood clots, or unable to pee.  It is normal to have some burning with urination the first few days to weeks, as well as urgency.  No strenuous activity for 1 to 2 weeks.  Can resume regular diet.  Follow-up in clinic in 6 weeks.   Discharge patient   Complete by:  As directed    Discharge disposition:  01-Home or Self Care   Discharge patient date:   11/10/2017     Allergies as of 11/10/2017   No Known Allergies     Medication List    STOP taking these medications   finasteride 5 MG tablet Commonly known as:  PROSCAR   oxybutynin 10 MG 24 hr tablet Commonly known as:  DITROPAN-XL   tamsulosin 0.4 MG Caps capsule Commonly known as:  FLOMAX     TAKE these medications   HYDROcodone-acetaminophen 5-325 MG tablet Commonly known as:  NORCO/VICODIN Take 1 tablet by mouth every 4 (four) hours as needed for up to 5 days for moderate pain.   hydrocortisone 2.5 % rectal cream Commonly known as:  ANUSOL-HC Place 1 application rectally 2 (two) times daily.   omeprazole 20 MG capsule Commonly known as:  PRILOSEC Take 20 mg by mouth daily.      Follow-up Information    Call Sondra Come, MD.   Specialty:  Urology Contact information: [address] [place] [address] [phone]           Signed: Jerilee Field 11/10/2017, 10:24 AM

## 2017-11-12 ENCOUNTER — Encounter: Payer: Self-pay | Admitting: Urology

## 2017-11-12 ENCOUNTER — Telehealth: Payer: Self-pay | Admitting: Urology

## 2017-11-12 NOTE — Telephone Encounter (Signed)
App made ° ° °[name]  °

## 2017-11-12 NOTE — Telephone Encounter (Signed)
-----   Message from Sondra Come, MD sent at 11/09/2017  5:01 PM EDT ----- Regarding: FOLLOW UP RTC with me in 6 weeks for uroflow/PVR/IPSS.  Legrand Rams, MD 11/09/2017

## 2017-11-13 LAB — SURGICAL PATHOLOGY

## 2017-11-16 ENCOUNTER — Telehealth: Payer: Self-pay | Admitting: Family Medicine

## 2017-11-16 NOTE — Telephone Encounter (Signed)
-----   Message from Sondra Come, MD sent at 11/15/2017  1:03 PM EDT ----- Please let him know his pathology showed only normal prostate tissue, no prostate cancer was seen.  Legrand Rams, MD 11/15/2017

## 2017-11-16 NOTE — Telephone Encounter (Signed)
Patient notified

## 2017-12-10 ENCOUNTER — Telehealth: Payer: Self-pay | Admitting: Urology

## 2017-12-10 NOTE — Telephone Encounter (Signed)
Troy Juarez from pt's place of employment calling to follow up on pts disability paperwork. Please call Pam at [phone]. Thank you.

## 2017-12-20 ENCOUNTER — Encounter: Payer: Self-pay | Admitting: Urology

## 2017-12-20 ENCOUNTER — Other Ambulatory Visit: Payer: Self-pay

## 2017-12-20 ENCOUNTER — Ambulatory Visit (INDEPENDENT_AMBULATORY_CARE_PROVIDER_SITE_OTHER): Payer: No Typology Code available for payment source | Admitting: Urology

## 2017-12-20 VITALS — BP 148/83 | HR 71 | Wt 175.0 lb

## 2017-12-20 DIAGNOSIS — N3281 Overactive bladder: Secondary | ICD-10-CM

## 2017-12-20 DIAGNOSIS — N400 Enlarged prostate without lower urinary tract symptoms: Secondary | ICD-10-CM

## 2017-12-20 LAB — BLADDER SCAN AMB NON-IMAGING: Scan Result: 57

## 2017-12-20 MED ORDER — TOLTERODINE TARTRATE ER 4 MG PO CP24: 4.0000 mg | ORAL_CAPSULE | Freq: Every day | ORAL | 11 refills | Status: DC

## 2017-12-20 NOTE — Progress Notes (Signed)
   12/20/2017 4:27 PM   Troy NoonFidel Juarez February 18, 1955 [id number]  Reason for visit: Follow up status post TURP 11/09/2017  HPI: I saw [name] back in clinic in follow-up after TURP.  He is a 62 year old Spanish-speaking male who initially presented with mixed symptoms including weak stream, feeling of incomplete emptying, frequency, and urgency.  He was trialed on anticholinergics, but had no improvement in his urinary symptoms.  He elected for TURP.  He reports he is voiding with a very strong stream, however he still has some bothersome urgency and frequency, as well as nocturia.  He denies any gross hematuria.  PVR is 54 cc in clinic today.  Assessment & Plan:   62 year old male with mixed urinary symptoms status post TURP.  Stream strength and emptying has improved, however he still has bothersome urgency and frequency and nocturia.  We discussed behavioral strategies including minimizing soda, coffee, and tea in the diet, as well as minimizing fluids in the evenings, and double voiding prior to bed.  We discussed a trial of either an anticholinergic or Myrbetriq.  He does not feel that he could afford Myrbetriq, and we will trial Detrol LA 4 mg daily.  If he does not have improvement with this, we could also try generic Vesicare in the future.  If he has severe symptoms refractory to medical management, he may ultimately be a candidate for intra-detrusor Botox.  Return in about 6 weeks (around 01/31/2018) for PVR.   10 minutes were spent with the patient today, greater than 50% were spent in direct face-to-face patient education and counseling regarding overactive bladder.  Sondra ComeBrian C Tiler Brandis, MD  [facility] [address], [address] [place], [place] [address] [phone]

## 2017-12-21 ENCOUNTER — Ambulatory Visit: Payer: Self-pay | Admitting: Urology

## 2018-01-24 ENCOUNTER — Ambulatory Visit (INDEPENDENT_AMBULATORY_CARE_PROVIDER_SITE_OTHER): Payer: No Typology Code available for payment source | Admitting: Urology

## 2018-01-24 ENCOUNTER — Encounter: Payer: Self-pay | Admitting: Urology

## 2018-01-24 VITALS — BP 123/81 | HR 81 | Wt 183.0 lb

## 2018-01-24 DIAGNOSIS — N400 Enlarged prostate without lower urinary tract symptoms: Secondary | ICD-10-CM

## 2018-01-24 DIAGNOSIS — N3281 Overactive bladder: Secondary | ICD-10-CM

## 2018-01-24 LAB — BLADDER SCAN AMB NON-IMAGING

## 2018-01-24 MED ORDER — SOLIFENACIN SUCCINATE 10 MG PO TABS: 10.0000 mg | ORAL_TABLET | Freq: Every day | ORAL | 11 refills | Status: DC

## 2018-01-24 NOTE — Progress Notes (Signed)
   01/24/2018 5:51 PM   Troy NoonFidel Juarez [birth year] [id number]  Reason for visit: Follow up BPH/LUTS  HPI: I saw Mr. [name] back in urology clinic today for BPH/LUTS.  He is a 62 year old Spanish-speaking male who initially presented with severe urinary symptoms including weak stream, feeling of incomplete emptying, frequency, and nocturia.  He had been trialed on anticholinergics with no improvement, and he elected to undergo TURP.  He underwent a transurethral resection of the prostate on 11/09/2017.  His symptoms have improved since then, but he continues to have some bothersome frequency throughout the day, and nocturia 3-4 times per night.  This is improved significantly from before surgery, when he had nocturia 9-10 times per night and frequency every 20 to 30 minutes during the day.  We had previously trialed him on Detrol 4 mg LA with some improvement in his symptoms.  He is still not completely satisfied, and he would like to trial a different anticholinergic.  We had discussed Myrbetriq previously, but he cannot afford this medication.  He also reports occasional perineal discomfort when voiding.  His PVRs have been less than 50 cc in follow-up.  -I provided samples for Vesicare today, and provided him a prescription for 10 mg daily and a good Rx coupon.  He discussed the risks and benefits of this medication.  -If he is still having bothersome symptoms at the next visit we could consider clinic cystoscopy to evaluate for stricture versus bladder neck contracture  RTC 3 months with PVR/IPSS  A total of 15 minutes were spent face-to-face with the patient, greater than 50% was spent in patient education, counseling, and coordination of care regarding BPH, post TURP expectations, and overactive bladder symptoms.   Sondra ComeBrian C Twilia Yaklin, MD  [facility] [address], [address] [place], [place] [address] [phone]

## 2018-04-23 ENCOUNTER — Encounter: Payer: Self-pay | Admitting: Urology

## 2018-04-23 ENCOUNTER — Ambulatory Visit: Payer: No Typology Code available for payment source | Admitting: Urology

## 2020-08-10 ENCOUNTER — Encounter: Payer: Self-pay | Admitting: *Deleted

## 2020-08-10 ENCOUNTER — Other Ambulatory Visit: Payer: Self-pay

## 2020-08-10 DIAGNOSIS — T83593A Infection and inflammatory reaction due to other urinary stents, initial encounter: Principal | ICD-10-CM | POA: Diagnosis present

## 2020-08-10 DIAGNOSIS — N401 Enlarged prostate with lower urinary tract symptoms: Secondary | ICD-10-CM | POA: Diagnosis present

## 2020-08-10 DIAGNOSIS — Z79899 Other long term (current) drug therapy: Secondary | ICD-10-CM

## 2020-08-10 DIAGNOSIS — N136 Pyonephrosis: Secondary | ICD-10-CM | POA: Diagnosis present

## 2020-08-10 DIAGNOSIS — N39490 Overflow incontinence: Secondary | ICD-10-CM | POA: Diagnosis present

## 2020-08-10 DIAGNOSIS — R7303 Prediabetes: Secondary | ICD-10-CM | POA: Diagnosis present

## 2020-08-10 DIAGNOSIS — F419 Anxiety disorder, unspecified: Secondary | ICD-10-CM | POA: Diagnosis present

## 2020-08-10 DIAGNOSIS — Y846 Urinary catheterization as the cause of abnormal reaction of the patient, or of later complication, without mention of misadventure at the time of the procedure: Secondary | ICD-10-CM | POA: Diagnosis present

## 2020-08-10 DIAGNOSIS — Z9079 Acquired absence of other genital organ(s): Secondary | ICD-10-CM

## 2020-08-10 DIAGNOSIS — Z87891 Personal history of nicotine dependence: Secondary | ICD-10-CM

## 2020-08-10 DIAGNOSIS — K219 Gastro-esophageal reflux disease without esophagitis: Secondary | ICD-10-CM | POA: Diagnosis present

## 2020-08-10 DIAGNOSIS — Z20822 Contact with and (suspected) exposure to covid-19: Secondary | ICD-10-CM | POA: Diagnosis present

## 2020-08-10 DIAGNOSIS — A4159 Other Gram-negative sepsis: Secondary | ICD-10-CM | POA: Diagnosis present

## 2020-08-10 LAB — BASIC METABOLIC PANEL
Anion gap: 6 (ref 5–15)
BUN: 24 mg/dL — ABNORMAL HIGH (ref 8–23)
CO2: 27 mmol/L (ref 22–32)
Calcium: 9.2 mg/dL (ref 8.9–10.3)
Chloride: 105 mmol/L (ref 98–111)
Creatinine, Ser: 0.92 mg/dL (ref 0.61–1.24)
GFR, Estimated: >60 mL/min (ref >60)
Glucose, Bld: 141 mg/dL — ABNORMAL HIGH (ref 70–99)
Potassium: 3.9 mmol/L (ref 3.5–5.1)
Sodium: 138 mmol/L (ref 135–145)

## 2020-08-10 LAB — CBC
HCT: 37.3 % — ABNORMAL LOW (ref 39.0–52.0)
Hemoglobin: 12.4 g/dL — ABNORMAL LOW (ref 13.0–17.0)
MCH: 30.3 pg (ref 26.0–34.0)
MCHC: 33.2 g/dL (ref 30.0–36.0)
MCV: 91.2 fL (ref 80.0–100.0)
Platelets: 469 10*3/uL — ABNORMAL HIGH (ref 150–400)
RBC: 4.09 MIL/uL — ABNORMAL LOW (ref 4.22–5.81)
RDW: 15.7 % — ABNORMAL HIGH (ref 11.5–15.5)
WBC: 16.8 10*3/uL — ABNORMAL HIGH (ref 4.0–10.5)
nRBC: 0 % (ref 0.0–0.2)

## 2020-08-10 LAB — URINALYSIS, COMPLETE (UACMP) WITH MICROSCOPIC
Bacteria, UA: NONE SEEN
Bilirubin Urine: NEGATIVE
Glucose, UA: NEGATIVE mg/dL
Ketones, ur: NEGATIVE mg/dL
Nitrite: POSITIVE — AB
Protein, ur: >=300 mg/dL — AB
Specific Gravity, Urine: 1.024 (ref 1.005–1.030)
Squamous Epithelial / HPF: NONE SEEN (ref 0–5)
pH: 8 (ref 5.0–8.0)

## 2020-08-10 NOTE — ED Triage Notes (Signed)
Via Spanish interpreter, Pt states he has a urinary catheter (urinary retention, d/t have out on Friday) and he is having pain and burning (clear yellow urine in leg bag). He is having back pain. Reports it is draining ok, reports yesterday did have "a little bit of blood". No fevers. Completed antibiotic therapy.    344 on bladder scan.

## 2020-08-11 ENCOUNTER — Encounter: Payer: Self-pay | Admitting: Internal Medicine

## 2020-08-11 ENCOUNTER — Inpatient Hospital Stay
Admission: EM | Admit: 2020-08-11 | Discharge: 2020-08-13 | DRG: 698 | Disposition: A | Discharge status: Home / Self Care | Payer: Self-pay | Attending: Internal Medicine | Admitting: Internal Medicine

## 2020-08-11 ENCOUNTER — Emergency Department: Payer: Self-pay

## 2020-08-11 DIAGNOSIS — N390 Urinary tract infection, site not specified: Secondary | ICD-10-CM

## 2020-08-11 DIAGNOSIS — A4150 Gram-negative sepsis, unspecified: Secondary | ICD-10-CM | POA: Insufficient documentation

## 2020-08-11 DIAGNOSIS — R338 Other retention of urine: Secondary | ICD-10-CM

## 2020-08-11 DIAGNOSIS — N400 Enlarged prostate without lower urinary tract symptoms: Secondary | ICD-10-CM | POA: Diagnosis present

## 2020-08-11 DIAGNOSIS — K219 Gastro-esophageal reflux disease without esophagitis: Secondary | ICD-10-CM | POA: Diagnosis present

## 2020-08-11 DIAGNOSIS — A419 Sepsis, unspecified organism: Secondary | ICD-10-CM

## 2020-08-11 DIAGNOSIS — N12 Tubulo-interstitial nephritis, not specified as acute or chronic: Secondary | ICD-10-CM

## 2020-08-11 LAB — PROTIME-INR
INR: 1 (ref 0.8–1.2)
Prothrombin Time: 13.4 seconds (ref 11.4–15.2)

## 2020-08-11 LAB — APTT: aPTT: 30 seconds (ref 24–36)

## 2020-08-11 LAB — RESP PANEL BY RT-PCR (FLU A&B, COVID) ARPGX2
Influenza A by PCR: NEGATIVE
Influenza B by PCR: NEGATIVE
SARS Coronavirus 2 by RT PCR: NEGATIVE

## 2020-08-11 LAB — HIV ANTIBODY (ROUTINE TESTING W REFLEX): HIV Screen 4th Generation wRfx: NONREACTIVE

## 2020-08-11 LAB — LACTIC ACID, PLASMA: Lactic Acid, Venous: 1.4 mmol/L (ref 0.5–1.9)

## 2020-08-11 MED ORDER — ACETAMINOPHEN 500 MG PO TABS
1000.0000 mg | ORAL_TABLET | Freq: Once | ORAL | Status: AC
  Administered 2020-08-11: 1000 mg via ORAL
  Filled 2020-08-11: qty 2

## 2020-08-11 MED ORDER — PANTOPRAZOLE SODIUM 40 MG PO TBEC
40.0000 mg | DELAYED_RELEASE_TABLET | Freq: Every day | ORAL | Status: DC
  Administered 2020-08-11 – 2020-08-13 (×3): 40 mg via ORAL
  Filled 2020-08-11 (×3): qty 1

## 2020-08-11 MED ORDER — SODIUM CHLORIDE 0.9 % IV SOLN
2.0000 g | Freq: Once | INTRAVENOUS | Status: AC
  Administered 2020-08-11: 2 g via INTRAVENOUS
  Filled 2020-08-11: qty 2

## 2020-08-11 MED ORDER — ACETAMINOPHEN 650 MG RE SUPP: 650.0000 mg | Freq: Four times a day (QID) | RECTAL | Status: DC | PRN

## 2020-08-11 MED ORDER — IBUPROFEN 400 MG PO TABS
400.0000 mg | ORAL_TABLET | Freq: Once | ORAL | Status: AC
  Administered 2020-08-11: 400 mg via ORAL
  Filled 2020-08-11: qty 1

## 2020-08-11 MED ORDER — ONDANSETRON HCL 4 MG PO TABS: 4.0000 mg | ORAL_TABLET | Freq: Four times a day (QID) | ORAL | Status: DC | PRN

## 2020-08-11 MED ORDER — LACTATED RINGERS IV BOLUS (SEPSIS)
2000.0000 mL | Freq: Once | INTRAVENOUS | Status: AC
  Administered 2020-08-11: 2000 mL via INTRAVENOUS

## 2020-08-11 MED ORDER — SODIUM CHLORIDE 0.9 % IV SOLN
2.0000 g | Freq: Three times a day (TID) | INTRAVENOUS | Status: DC
  Administered 2020-08-11 – 2020-08-13 (×6): 2 g via INTRAVENOUS
  Filled 2020-08-11 (×8): qty 2

## 2020-08-11 MED ORDER — DARIFENACIN HYDROBROMIDE ER 15 MG PO TB24: 15.0000 mg | ORAL_TABLET | Freq: Every day | ORAL | Status: DC

## 2020-08-11 MED ORDER — ONDANSETRON HCL 4 MG/2ML IJ SOLN
4.0000 mg | Freq: Four times a day (QID) | INTRAMUSCULAR | Status: DC | PRN
Start: 2020-08-11 — End: 2020-08-13
  Administered 2020-08-11: 4 mg via INTRAVENOUS
  Filled 2020-08-11: qty 2

## 2020-08-11 MED ORDER — FESOTERODINE FUMARATE ER 4 MG PO TB24
4.0000 mg | ORAL_TABLET | Freq: Every day | ORAL | Status: DC
  Administered 2020-08-11 – 2020-08-12 (×2): 4 mg via ORAL
  Filled 2020-08-11 (×2): qty 1

## 2020-08-11 MED ORDER — LIDOCAINE HCL URETHRAL/MUCOSAL 2 % EX GEL
1.0000 "application " | Freq: Once | CUTANEOUS | Status: AC
  Administered 2020-08-11: 1 via URETHRAL

## 2020-08-11 MED ORDER — ACETAMINOPHEN 325 MG PO TABS
650.0000 mg | ORAL_TABLET | Freq: Four times a day (QID) | ORAL | Status: DC | PRN
  Administered 2020-08-11 – 2020-08-13 (×4): 650 mg via ORAL
  Filled 2020-08-11 (×4): qty 2

## 2020-08-11 MED ORDER — MIDAZOLAM HCL 2 MG/2ML IJ SOLN
2.0000 mg | Freq: Once | INTRAMUSCULAR | Status: AC
  Administered 2020-08-11: 2 mg via INTRAVENOUS
  Filled 2020-08-11: qty 2

## 2020-08-11 MED ORDER — SODIUM CHLORIDE 0.9 % IV SOLN: 2.0000 g | Freq: Once | INTRAVENOUS | Status: DC

## 2020-08-11 MED ORDER — LACTATED RINGERS IV SOLN: INTRAVENOUS | Status: AC

## 2020-08-11 NOTE — Progress Notes (Signed)
CODE SEPSIS - PHARMACY COMMUNICATION  **Broad Spectrum Antibiotics should be administered within 1 hour of Sepsis diagnosis**  Time Code Sepsis Called/Page Received: 7/6 @ 0247  Antibiotics Ordered: Cefepime 2 gm IV X 1   Time of 1st antibiotic administration: 7/6 @ 0343  Additional action taken by pharmacy: none  If necessary, Name of Provider/Nurse Contacted:     Ottis Vacha D ,PharmD Clinical Pharmacist  08/11/2020  4:15 AM

## 2020-08-11 NOTE — ED Notes (Signed)
This RN attempted to insert a 16g coude cath after admin or lidocaine jelly without success. This RN informed MD and MD requested for RN to attempt inserting an 18 coude cath. Again RN unable to complete insertion due to resistance. RN called ED MD to beside to observe attempt. MD then attempted to insert 18g cath without success, then attempted to insert using guidewire. MD unable to advance guidewire past resistance. Urine return of unmeasurable amount and drainage noted several time throughout attempts.

## 2020-08-11 NOTE — ED Notes (Signed)
Resumed care from amber rn.  Pt alert.  Sinus tach on monitor.  Pt waiting on admission bed.

## 2020-08-11 NOTE — ED Provider Notes (Signed)
[facility] Department Provider Note ____________________________________________   Event Date/Time   First MD Initiated Contact with Patient 08/11/20 [phone]     (approximate)  I have reviewed the triage vital signs and the nursing notes.  HISTORY  Chief Complaint Back Pain   HPI Troy Juarez is a 65 y.o. malewho presents to the ED for evaluation of back pain.   Chart review indicates hx BPH,  Pt was seen on 6/25 at [facility] for acute urinary retention, indwelling Foley catheter was placed and he was referred out to urology.  He was given a 7-day course of Keflex, finishing 4 days ago.  Patient presents to the ED today for evaluation of back pain and suprapubic pain over the past 24 hours.  He reports finishing the Keflex antibiotic course, and feeling fine until yesterday when he developed increasing pain to his back and suprapubic abdomen.  Denies any fevers at home, though was noted to have a fever of 103.3 F here in the ED.  Reports continued drainage of his Foley catheter.  Denies any emesis, diarrhea, syncopal episodes or falls.  Spanish interpreter utilized for history and physical  Past Medical History:  Diagnosis Date   Anxiety    Arthritis    GERD (gastroesophageal reflux disease)    Pre-diabetes     Patient Active Problem List   Diagnosis Date Noted   BPH (benign prostatic hyperplasia) 11/09/2017    Past Surgical History:  Procedure Laterality Date   CHOLECYSTECTOMY     EYE SURGERY     OTHER SURGICAL HISTORY     gallbladder   TRANSURETHRAL RESECTION OF PROSTATE N/A 11/09/2017   Procedure: Bipolar TRANSURETHRAL RESECTION OF THE PROSTATE (TURP);  Surgeon: Sondra Come, MD;  Location: [facility] ORS;  Service: Urology;  Laterality: N/A;  Bipolar TURP    Prior to Admission medications   Medication Sig Start Date End Date Taking? Authorizing Provider  hydrocortisone (ANUSOL-HC) 2.5 % rectal cream Place 1 application  rectally 2 (two) times daily.    [provider]  omeprazole (PRILOSEC) 20 MG capsule Take 20 mg by mouth daily.    [provider]  solifenacin (VESICARE) 10 MG tablet Take 1 tablet (10 mg total) by mouth daily. 01/24/18   Sondra Come, MD  tolterodine (DETROL LA) 4 MG 24 hr capsule Take 1 capsule (4 mg total) by mouth daily. 12/20/17   Sondra Come, MD    Allergies Patient has no known allergies.  Family History  Problem Relation Age of Onset   Prostate cancer Neg Hx    Bladder Cancer Neg Hx    Kidney cancer Neg Hx     Social History Social History   Tobacco Use   Smoking status: Former    Pack years: 0.00   Smokeless tobacco: Never  Vaping Use   Vaping Use: Never used  Substance Use Topics   Alcohol use: Not Currently   Drug use: Not Currently    Review of Systems  Constitutional: No fever/chills Eyes: No visual changes. ENT: No sore throat. Cardiovascular: Denies chest pain. Respiratory: Denies shortness of breath. Gastrointestinal: Positive for atraumatic suprapubic and left-sided flank/CVA pain  No nausea, no vomiting.  No diarrhea.  No constipation. Genitourinary: Negative for dysuria. Musculoskeletal: Negative for back pain. Skin: Negative for rash. Neurological: Negative for headaches, focal weakness or numbness.  ____________________________________________   PHYSICAL EXAM:  VITAL SIGNS: Vitals:   08/10/20 2327 08/11/20 0330  BP:  (!) 142/77  Pulse:  Marland Kitchen)  119  Resp:  18  Temp: 100 F (37.8 C)   SpO2:  93%      Constitutional: Alert and oriented.  Appears uncomfortable, but in no acute distress.  I rechecked his temperature while I am in the room due to his sweatiness/diaphoresis, noted temperature of 83.3. Eyes: Conjunctivae are normal. PERRL. EOMI. Head: Atraumatic. Nose: No congestion/rhinnorhea. Mouth/Throat: Mucous membranes are moist.  Oropharynx non-erythematous. Neck: No stridor. No cervical spine tenderness  to palpation. Cardiovascular: Tachycardic rate, regular rhythm. Grossly normal heart sounds.  Good peripheral circulation. Respiratory: Normal respiratory effort.  No retractions. Lungs CTAB. Gastrointestinal: Soft , nondistended.  Suprapubic tenderness is present, no peritoneal features.  Abdomen is otherwise benign. Left-sided CVA tenderness is present.  None on the right. Musculoskeletal: No lower extremity tenderness nor edema.  No joint effusions. No signs of acute trauma. Neurologic:  Normal speech and language. No gross focal neurologic deficits are appreciated. No gait instability noted. Skin:  Skin is warm, moist and intact. No rash noted.  Diaphoresis is noted. Psychiatric: Mood and affect are normal. Speech and behavior are normal.  ____________________________________________   LABS (all labs ordered are listed, but only abnormal results are displayed)  Labs Reviewed  URINALYSIS, COMPLETE (UACMP) WITH MICROSCOPIC - Abnormal; Notable for the following components:      Result Value   Color, Urine YELLOW (*)    APPearance TURBID (*)    Hgb urine dipstick MODERATE (*)    Protein, ur >=300 (*)    Nitrite POSITIVE (*)    Leukocytes,Ua MODERATE (*)    All other components within normal limits  BASIC METABOLIC PANEL - Abnormal; Notable for the following components:   Glucose, Bld 141 (*)    BUN 24 (*)    All other components within normal limits  CBC - Abnormal; Notable for the following components:   WBC 16.8 (*)    RBC 4.09 (*)    Hemoglobin 12.4 (*)    HCT 37.3 (*)    RDW 15.7 (*)    Platelets 469 (*)    All other components within normal limits  URINE CULTURE  RESP PANEL BY RT-PCR (FLU A&B, COVID) ARPGX2  CULTURE, BLOOD (ROUTINE X 2)  CULTURE, BLOOD (ROUTINE X 2)  LACTIC ACID, PLASMA  PROTIME-INR  APTT  LACTIC ACID, PLASMA   ____________________________________________  12 Lead EKG  Sinus tachycardia, rate of 123 bpm.  Rightward axis and normal intervals.  No  acute ischemia. ____________________________________________  RADIOLOGY  ED MD interpretation: CT renal study reviewed by me with intraurethral Foley catheter and bladder wall thickening without urologic obstruction.  Official radiology report(s): CT Renal Stone Study  Result Date: 08/11/2020 CLINICAL DATA:  Back pain and sepsis. Evaluate for pyelonephritis or urinary obstruction EXAM: CT ABDOMEN AND PELVIS WITHOUT CONTRAST TECHNIQUE: Multidetector CT imaging of the abdomen and pelvis was performed following the standard protocol without IV contrast. COMPARISON:  None. FINDINGS: Lower chest:  No contributory findings. Hepatobiliary: No focal liver abnormality.Cholecystectomy Pancreas: Unremarkable. Spleen: Unremarkable. Adrenals/Urinary Tract: Negative adrenals. Extensive perivesicular stranding. No asymmetric renal enlargement or stranding. No hydronephrosis. There is a 4 mm right renal calculus which is nonobstructive. The left ureter appears to insert eccentrically on the bladder. Layering calcification in the urinary bladder. A Foley catheter is inflated in the posterior urethra. Small left renal cystic density. Stomach/Bowel:  No obstruction. No appendicitis. Vascular/Lymphatic: No acute vascular abnormality. No mass or adenopathy. Reproductive:No pathologic findings. Other: No ascites or pneumoperitoneum. Musculoskeletal: No acute abnormalities. Spondylosis with  multi-level bridging lower thoracic osteophytes. These results were called by telephone at the time of interpretation on 08/11/2020 at 4:24 am to provider [facility] , who verbally acknowledged these results. IMPRESSION: 1. Cystitis. 2. Inflated Foley catheter at the urethra. 3. Bladder and right renal calculi.  No hydronephrosis. Electronically Signed   By: Marnee Spring M.D.   On: 08/11/2020 04:24    ____________________________________________   PROCEDURES and INTERVENTIONS  Procedure(s) performed (including Critical Care):  .1-3  Lead EKG Interpretation  Date/Time: 08/11/2020 4:30 AM Performed by: Delton Prairie, MD Authorized by: Delton Prairie, MD     Interpretation: abnormal     ECG rate:  114   ECG rate assessment: tachycardic     Rhythm: sinus tachycardia     Ectopy: none     Conduction: normal   .Critical Care  Date/Time: 08/11/2020 4:30 AM Performed by: Delton Prairie, MD Authorized by: Delton Prairie, MD   Critical care provider statement:    Critical care time (minutes):  45   Critical care was necessary to treat or prevent imminent or life-threatening deterioration of the following conditions:  Sepsis   Critical care was time spent personally by me on the following activities:  Discussions with consultants, evaluation of patient's response to treatment, examination of patient, ordering and performing treatments and interventions, ordering and review of laboratory studies, ordering and review of radiographic studies, pulse oximetry, re-evaluation of patient's condition, obtaining history from patient or surrogate and review of old charts  Medications  lactated ringers infusion (has no administration in time range)  lactated ringers bolus 2,000 mL (2,000 mLs Intravenous New Bag/Given 08/11/20 0341)  ceFEPIme (MAXIPIME) 2 g in sodium chloride 0.9 % 100 mL IVPB (2 g Intravenous New Bag/Given 08/11/20 0343)  acetaminophen (TYLENOL) tablet 1,000 mg (1,000 mg Oral Given 08/11/20 0314)    ____________________________________________   MDM / ED COURSE   Pleasant and largely healthy 65 year old male presents to the ED with evidence of pyelonephritis in the setting of recent indwelling Foley catheter placement requiring medical admission.  Patient is febrile and tachycardic, but remains hemodynamically stable.  Blood work with leukocytosis, but no lactic acidosis.  Renal function remains intact.  Urinalysis with infectious features and was sent for a culture.  Patient meets sepsis criteria and appropriate protocols were  followed.  Cefepime provided due to his recent instrumentation and Foley catheter placement.  CT renal study unfortunately demonstrates intraurethral balloon of his Foley catheter, but no other evidence of urologic obstruction or ureteral lithiasis.  We will replace his Foley catheter appropriately and discuss the case with hospitalist for admission.  Clinical Course as of 08/11/20 0429  Wed Aug 11, 2020  0301 Repeat temp while I'm in the room, 103.3*F orally. I update RN on plan of care. Pt is also agreeable [DS]    Clinical Course User Index [DS] Delton Prairie, MD    ____________________________________________   FINAL CLINICAL IMPRESSION(S) / ED DIAGNOSES  Final diagnoses:  Pyelonephritis  Sepsis without acute organ dysfunction, due to unspecified organism [facility])     ED Discharge Orders     None        Jerney Baksh   Note:  This document was prepared using Dragon voice recognition software and may include unintentional dictation errors.    Delton Prairie, MD 08/11/20 [phone]

## 2020-08-11 NOTE — ED Notes (Signed)
[facility] ED tech completed bladder scan on pt and reported in bladder. Urologist made aware.

## 2020-08-11 NOTE — H&P (Signed)
History and Physical    Troy Juarez MRN:[id number] DOB: 03/28/1955 DOA: 08/11/2020  PCP: Rubio, Jessica, NP   Patient coming from: Home  I have personally briefly reviewed patient's old medical records in [facility]  Chief Complaint: Back pain  HPI: Troy Juarez is a 64 y.o. male with medical history significant for GERD, anxiety, arthritis, urinary retention status post Foley catheter insertion who presents to the emergency room for evaluation of back pain as well as pain and burning sensation around the penile area. Patient was seen at [facility] on 07/31/20 for evaluation of abdominal pain mostly suprapubic area and decreased urine output.  The ER staff at [facility] attempted to insert a Foley catheter but were unsuccessful and so patient was transferred to [place] where urology placed a Foley catheter with camera assistance.  Patient was discharged home on a 1 week course of Keflex for prophylaxis which he has completed. He presents to the ER for evaluation of back and suprapubic pain associated with burning sensation around the penile area.  He was noted to be febrile in the ER with a T-max of 103.3.   He denies having any diarrhea, no constipation, no nausea, no vomiting, no dizziness, no lightheadedness, no cough, no focal deficits, no headache, no blurred vision. Labs show sodium 138, potassium 3.9, chloride 105, bicarb 27, glucose 141, BUN 24, creatinine 0.92, calcium 9.2, lactic acid 1.4, white count 16.8, hemoglobin 12.4, hematocrit 37.3, MCV 91.2, RDW 15.7, platelet count 469, PT 13.4, INR 1.0 Respiratory viral panel is negative CT renal stone study shows cystitis. Inflated Foley catheter at the urethra.Bladder and right renal calculi.  No hydronephrosis. EKG reviewed by me shows sinus tachycardia with low voltage QRS and nonspecific T wave changes in the lateral leads.     ED Course: Patient is a 64-year-old male who presents to the ER for  evaluation of back and suprapubic pain.  He is status post insertion of a Foley catheter for acute urinary retention.  Imaging showed inflation of the Foley catheter in the urethra and so it was removed in the emergency room and the emergency room staff have had difficulty reinserting a Foley catheter. Patient noted to have overflow incontinence and the ER staff had to change his linen due to urinary incontinence. Patient met sepsis criteria and received 2 L IV fluid bolus in the ER as well as a dose of IV cefepime. Urology has been consulted to reinsert Foley catheter Patient will be admitted to the hospital for further evaluation.      Review of Systems: As per HPI otherwise all other systems reviewed and negative.    Past Medical History:  Diagnosis Date   Anxiety    Arthritis    GERD (gastroesophageal reflux disease)    Pre-diabetes     Past Surgical History:  Procedure Laterality Date   CHOLECYSTECTOMY     EYE SURGERY     OTHER SURGICAL HISTORY     gallbladder   TRANSURETHRAL RESECTION OF PROSTATE N/A 11/09/2017   Procedure: Bipolar TRANSURETHRAL RESECTION OF THE PROSTATE (TURP);  Surgeon: Sninsky, Brian C, MD;  Location: [facility] ORS;  Service: Urology;  Laterality: N/A;  Bipolar TURP     reports that he has quit smoking. He has never used smokeless tobacco. He reports previous alcohol use. He reports previous drug use.  No Known Allergies  Family History  Problem Relation Age of Onset   Prostate cancer Neg Hx    Bladder Cancer Neg Hx      Kidney cancer Neg Hx      Prior to Admission medications   Medication Sig Start Date End Date Taking? Authorizing Provider  hydrocortisone (ANUSOL-HC) 2.5 % rectal cream Place 1 application rectally 2 (two) times daily.    [provider]  omeprazole (PRILOSEC) 20 MG capsule Take 20 mg by mouth daily.    [provider]  solifenacin (VESICARE) 10 MG tablet Take 1 tablet (10 mg total) by mouth daily. 01/24/18   Sninsky,  Brian C, MD  tolterodine (DETROL LA) 4 MG 24 hr capsule Take 1 capsule (4 mg total) by mouth daily. 12/20/17   Sninsky, Brian C, MD    Physical Exam: Vitals:   08/11/20 0245 08/11/20 0330 08/11/20 0700 08/11/20 0709  BP:  (!) 142/77 120/73   Pulse:  (!) 119 (!) 115   Resp:  18    Temp: (!) 103 F (39.4 C)   (!) 101.2 F (38.4 C)  TempSrc: Oral   Oral  SpO2:  93% 95%      Vitals:   08/11/20 0245 08/11/20 0330 08/11/20 0700 08/11/20 0709  BP:  (!) 142/77 120/73   Pulse:  (!) 119 (!) 115   Resp:  18    Temp: (!) 103 F (39.4 C)   (!) 101.2 F (38.4 C)  TempSrc: Oral   Oral  SpO2:  93% 95%       Constitutional: Alert and oriented x 3 . Not in any apparent distress HEENT:      Head: Normocephalic and atraumatic.         Eyes: PERLA, EOMI, Conjunctivae are normal. Sclera is non-icteric.       Mouth/Throat: Mucous membranes are moist.       Neck: Supple with no signs of meningismus. Cardiovascular: Tachycardic. No murmurs, gallops, or rubs. 2+ symmetrical distal pulses are present . No JVD. No LE edema Respiratory: Respiratory effort normal .Lungs sounds clear bilaterally. No wheezes, crackles, or rhonchi.  Gastrointestinal: Soft, suprapubic tender, and non distended with positive bowel sounds.  Genitourinary: No CVA tenderness. Musculoskeletal: Nontender with normal range of motion in all extremities. No cyanosis, or erythema of extremities. Neurologic:  Face is symmetric. Moving all extremities. No gross focal neurologic deficits . Skin: Skin is warm, dry.  No rash or ulcers Psychiatric: Mood and affect are normal    Labs on Admission: I have personally reviewed following labs and imaging studies  CBC: Recent Labs  Lab 08/10/20 2257  WBC 16.8*  HGB 12.4*  HCT 37.3*  MCV 91.2  PLT 469*   Basic Metabolic Panel: Recent Labs  Lab 08/10/20 2257  NA 138  K 3.9  CL 105  CO2 27  GLUCOSE 141*  BUN 24*  CREATININE 0.92  CALCIUM 9.2   GFR: CrCl cannot be  calculated (Unknown ideal weight.). Liver Function Tests: No results for input(s): AST, ALT, ALKPHOS, BILITOT, PROT, ALBUMIN in the last 168 hours. No results for input(s): LIPASE, AMYLASE in the last 168 hours. No results for input(s): AMMONIA in the last 168 hours. Coagulation Profile: Recent Labs  Lab 08/11/20 0320  INR 1.0   Cardiac Enzymes: No results for input(s): CKTOTAL, CKMB, CKMBINDEX, TROPONINI in the last 168 hours. BNP (last 3 results) No results for input(s): PROBNP in the last 8760 hours. HbA1C: No results for input(s): HGBA1C in the last 72 hours. CBG: No results for input(s): GLUCAP in the last 168 hours. Lipid Profile: No results for input(s): CHOL, HDL, LDLCALC, TRIG, CHOLHDL, LDLDIRECT in the   last 72 hours. Thyroid Function Tests: No results for input(s): TSH, T4TOTAL, FREET4, T3FREE, THYROIDAB in the last 72 hours. Anemia Panel: No results for input(s): VITAMINB12, FOLATE, FERRITIN, TIBC, IRON, RETICCTPCT in the last 72 hours. Urine analysis:    Component Value Date/Time   COLORURINE YELLOW (A) 08/10/2020 2257   APPEARANCEUR TURBID (A) 08/10/2020 2257   APPEARANCEUR Clear 10/02/2017 1031   LABSPEC 1.024 08/10/2020 2257   PHURINE 8.0 08/10/2020 2257   GLUCOSEU NEGATIVE 08/10/2020 2257   HGBUR MODERATE (A) 08/10/2020 2257   BILIRUBINUR NEGATIVE 08/10/2020 2257   BILIRUBINUR Negative 10/02/2017 1031   KETONESUR NEGATIVE 08/10/2020 2257   PROTEINUR >=300 (A) 08/10/2020 2257   NITRITE POSITIVE (A) 08/10/2020 2257   LEUKOCYTESUR MODERATE (A) 08/10/2020 2257    Radiological Exams on Admission: CT Renal Stone Study  Result Date: 08/11/2020 CLINICAL DATA:  Back pain and sepsis. Evaluate for pyelonephritis or urinary obstruction EXAM: CT ABDOMEN AND PELVIS WITHOUT CONTRAST TECHNIQUE: Multidetector CT imaging of the abdomen and pelvis was performed following the standard protocol without IV contrast. COMPARISON:  None. FINDINGS: Lower chest:  No contributory  findings. Hepatobiliary: No focal liver abnormality.Cholecystectomy Pancreas: Unremarkable. Spleen: Unremarkable. Adrenals/Urinary Tract: Negative adrenals. Extensive perivesicular stranding. No asymmetric renal enlargement or stranding. No hydronephrosis. There is a 4 mm right renal calculus which is nonobstructive. The left ureter appears to insert eccentrically on the bladder. Layering calcification in the urinary bladder. A Foley catheter is inflated in the posterior urethra. Small left renal cystic density. Stomach/Bowel:  No obstruction. No appendicitis. Vascular/Lymphatic: No acute vascular abnormality. No mass or adenopathy. Reproductive:No pathologic findings. Other: No ascites or pneumoperitoneum. Musculoskeletal: No acute abnormalities. Spondylosis with multi-level bridging lower thoracic osteophytes. These results were called by telephone at the time of interpretation on 08/11/2020 at 4:24 am to provider DYLAN SMITH , who verbally acknowledged these results. IMPRESSION: 1. Cystitis. 2. Inflated Foley catheter at the urethra. 3. Bladder and right renal calculi.  No hydronephrosis. Electronically Signed   By: Jonathon  Watts M.D.   On: 08/11/2020 04:24     Assessment/Plan Principal Problem:   Sepsis secondary to UTI (HCC) Active Problems:   BPH (benign prostatic hyperplasia)   GERD (gastroesophageal reflux disease)     Sepsis secondary to UTI As evidenced by fever with a T-max of 103, tachycardia, leukocytosis and pyuria. 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  All questions and concerns have been addressed.  He verbalizes understanding and agrees with the plan. Disposition Plan: Back to previous home environment Consults called: Urology Status: At the time of admission, it appears that the appropriate admission status for this patient is inpatient. This is judged to be reasonable and necessary in order to provide the required intensity of service to ensure the patient's safety given the presenting symptoms, physical exam findings, and initial radiographic and laboratory data in the context of the comorbid conditions.   Patient requires inpatient status due to high intensity of service, high risk of further deterioration and high frequency of surveillance required.    Collier Bullock MD [facility]     08/11/2020, 9:22 AM

## 2020-08-11 NOTE — Progress Notes (Signed)
MEWS a 2 yellow. Was a 3 in ED. Due to heart rate. Pt here for sepsis. Fluids restarted. Will recheck vitals in 2 hours. Discussed with charge nurse.

## 2020-08-11 NOTE — ED Notes (Signed)
appr ox 900 cc urine in foley bag.  States feeling better.

## 2020-08-11 NOTE — ED Notes (Signed)
Report messaged to gwen rn floor nurse

## 2020-08-11 NOTE — Consult Note (Signed)
Urology Consult  I have been asked to see the patient by Dr. Lucile Shuttersochukwu Agbata, for evaluation and management of urinary retention.    Chief Complaint: Urinary retention  History of Present Illness: Troy Juarez is a 65 y.o. year old male who underwent TURP in 2019 for BPH with LU TS.  He states he continued to have frequency and nocturia and was tried on different OAB medications.  When he was last seen in December 2019, he was started on Vesicare and was to return in 3 months.  If he was still having bothersome symptoms at that time plans were to undergo cystoscopy to evaluate for stricture versus a bladder neck contracture.  [facility] July [facility], 2020 for dysuria, abdominal pain and decreased urinary output and dribbling.  A CT scanning at that time demonstrated mild dilation of the left ureter, distended bladder and a nonobstructing right renal calculi measuring about 4 mm.  ED staff was unable to place a Foley catheter.  Urology ultimately placed a 2314 [address] council tip catheter under cystoscopic guidance with a 700 cc return.  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  He was prescribed 1 week of Keflex given the extensive urinary instrumentation and plans were to coordinate with Dr. Campbell StallFibular for consideration of bladder neck incision.  Urine culture was negative.  He presented to the ED this morning for the complaints of back and suprapubic pain.  He is found to be febrile in the ED with a temp of 103.3.  CT renal stone study noted that the Foley catheter balloon was located in the posterior urethra.    Foley catheter has since been removed.  Attempts were made by ED staff to replace, but have been unsuccessful.  UA nitrite positive.  Serum creatinine 0.92.  WBC count 16.8.  Urine has been sent for culture.  Lactic acid 1.4.  He has voiding 475 cc spontaneously and  bladder scan 170 cc.    He states that he feels comfortable and that his bladder is empty at this time.  He denied any known tugging or pulling of the Foley catheter since its insertion.  He states it was draining well for the most part at home.  History obtained by chart review and interviewing the patient with interpreter, Shirlee LimerickMarion # [phone]  Past Medical History:  Diagnosis Date   Anxiety    Arthritis    GERD (gastroesophageal reflux disease)    Pre-diabetes     Past Surgical History:  Procedure Laterality Date   CHOLECYSTECTOMY     EYE SURGERY     OTHER SURGICAL HISTORY     gallbladder   TRANSURETHRAL RESECTION OF PROSTATE N/A 11/09/2017   Procedure: Bipolar TRANSURETHRAL RESECTION OF THE PROSTATE (TURP);  Surgeon: Sondra ComeSninsky, Brian C, MD;  Location: [facility] ORS;  Service: Urology;  Laterality: N/A;  Bipolar TURP    Home Medications:  No current facility-administered medications on file prior to encounter.   Current Outpatient Medications on File Prior to Encounter  Medication Sig Dispense Refill   hydrocortisone (ANUSOL-HC) 2.5 % rectal cream Place 1 application rectally 2 (two) times daily.     omeprazole (PRILOSEC) 20 MG capsule Take 20 mg by mouth daily.     solifenacin (VESICARE) 10 MG tablet Take 1 tablet (10 mg total) by mouth daily. 30 tablet 11   tolterodine (DETROL LA) 4 MG 24 hr capsule  Take 1 capsule (4 mg total) by mouth daily. 30 capsule 11     Allergies: No Known Allergies  Family History  Problem Relation Age of Onset   Prostate cancer Neg Hx    Bladder Cancer Neg Hx    Kidney cancer Neg Hx     Social History:  reports that he has quit smoking. He has never used smokeless tobacco. He reports previous alcohol use. He reports previous drug use.  ROS: A complete review of systems was performed.  All systems are negative except for pertinent findings as noted.  Physical Exam:  Vital signs in last 24 hours: Temp:  [100 F (37.8 C)-103 F (39.4 C)] 101.2 F  (38.4 C) (07/06 0709) Pulse Rate:  [115-119] 115 (07/06 0700) Resp:  [18-20] 18 (07/06 0330) BP: (120-162)/(73-104) 120/73 (07/06 0700) SpO2:  [93 %-97 %] 95 % (07/06 0700) Weight:  [84.1 kg] 84.1 kg (07/06 1021) Constitutional:  Alert and oriented, No acute distress HEENT: [place] AT, moist mucus membranes.  Trachea midline Cardiovascular: Regular rate and rhythm, no clubbing, cyanosis, or edema. Respiratory: Normal respiratory effort, lungs clear bilaterally GI: Abdomen is soft, nontender, nondistended, no abdominal masses GU: No CVA tenderness.  Bladder not palpable.  Patient with a normal uncircumcised phallus.  Foreskin is easily retracted. Skin: No rashes, bruises or suspicious lesions Neurologic: Grossly intact, no focal deficits, moving all 4 extremities Psychiatric: Normal mood and affect   Laboratory Data:  Recent Labs    08/10/20 2257  WBC 16.8*  HGB 12.4*  HCT 37.3*   Recent Labs    08/10/20 2257  NA 138  K 3.9  CL 105  CO2 27  GLUCOSE 141*  BUN 24*  CREATININE 0.92  CALCIUM 9.2   Recent Labs    08/11/20 0320  INR 1.0   No results for input(s): LABURIN in the last 72 hours. Results for orders placed or performed during the hospital encounter of 08/11/20  Resp Panel by RT-PCR (Flu A&B, Covid) Nasopharyngeal Swab     Status: None   Collection Time: 08/11/20  3:20 AM   Specimen: Nasopharyngeal Swab; Nasopharyngeal(NP) swabs in vial transport medium  Result Value Ref Range Status   SARS Coronavirus 2 by RT PCR NEGATIVE NEGATIVE Final    Comment: (NOTE) SARS-CoV-2 target nucleic acids are NOT DETECTED.  The SARS-CoV-2 RNA is generally detectable in upper respiratory specimens during the acute phase of infection. The lowest concentration of SARS-CoV-2 viral copies this assay can detect is 138 copies/mL. A negative result does not preclude SARS-Cov-2 infection and should not be used as the sole basis for treatment or other patient management decisions. A  negative result may occur with  improper specimen collection/handling, submission of specimen other than nasopharyngeal swab, presence of viral mutation(s) within the areas targeted by this assay, and inadequate number of viral copies(<138 copies/mL). A negative result must be combined with clinical observations, patient history, and epidemiological information. The expected result is Negative.  Fact Sheet for Patients:  BloggerCourse.com  Fact Sheet for Healthcare Providers:  SeriousBroker.it  This test is no t yet approved or cleared by the [place] FDA and  has been authorized for detection and/or diagnosis of SARS-CoV-2 by FDA under an Emergency Use Authorization (EUA). This EUA will remain  in effect (meaning this test can be used) for the duration of the COVID-19 declaration under Section 564(b)(1) of the Act, 21 U.S.C.section 360bbb-3(b)(1), unless the authorization is terminated  or revoked sooner.  Influenza A by PCR NEGATIVE NEGATIVE Final   Influenza B by PCR NEGATIVE NEGATIVE Final    Comment: (NOTE) The Xpert Xpress SARS-CoV-2/FLU/RSV plus assay is intended as an aid in the diagnosis of influenza from Nasopharyngeal swab specimens and should not be used as a sole basis for treatment. Nasal washings and aspirates are unacceptable for Xpert Xpress SARS-CoV-2/FLU/RSV testing.  Fact Sheet for Patients: BloggerCourse.com  Fact Sheet for Healthcare Providers: SeriousBroker.it  This test is not yet approved or cleared by the [place] FDA and has been authorized for detection and/or diagnosis of SARS-CoV-2 by FDA under an Emergency Use Authorization (EUA). This EUA will remain in effect (meaning this test can be used) for the duration of the COVID-19 declaration under Section 564(b)(1) of the Act, 21 U.S.C. section 360bbb-3(b)(1), unless the authorization  is terminated or revoked.  Performed at [facility], [address] [place], [place] [address]   Blood Culture (routine x 2)     Status: None (Preliminary result)   Collection Time: 08/11/20  3:20 AM   Specimen: BLOOD  Result Value Ref Range Status   Specimen Description BLOOD LEFT ASSIST CONTROL  Final   Special Requests   Final    BOTTLES DRAWN AEROBIC AND ANAEROBIC Blood Culture adequate volume   Culture   Final    NO GROWTH < 12 HOURS Performed at [facility], [address] [place], [place] [address]    Report Status PENDING  Incomplete  Blood Culture (routine x 2)     Status: None (Preliminary result)   Collection Time: 08/11/20  3:20 AM   Specimen: BLOOD  Result Value Ref Range Status   Specimen Description BLOOD LEFT FOREARM  Final   Special Requests   Final    BOTTLES DRAWN AEROBIC AND ANAEROBIC Blood Culture adequate volume   Culture   Final    NO GROWTH < 12 HOURS Performed at [facility], [address], [place], [place] [address]    Report Status PENDING  Incomplete     Radiologic Imaging: CT Renal Stone Study  Result Date: 08/11/2020 CLINICAL DATA:  Back pain and sepsis. Evaluate for pyelonephritis or urinary obstruction EXAM: CT ABDOMEN AND PELVIS WITHOUT CONTRAST TECHNIQUE: Multidetector CT imaging of the abdomen and pelvis was performed following the standard protocol without IV contrast. COMPARISON:  None. FINDINGS: Lower chest:  No contributory findings. Hepatobiliary: No focal liver abnormality.Cholecystectomy Pancreas: Unremarkable. Spleen: Unremarkable. Adrenals/Urinary Tract: Negative adrenals. Extensive perivesicular stranding. No asymmetric renal enlargement or stranding. No hydronephrosis. There is a 4 mm right renal calculus which is nonobstructive. The left ureter appears to insert eccentrically on the bladder. Layering calcification in the urinary bladder. A Foley catheter is inflated in the posterior urethra. Small  left renal cystic density. Stomach/Bowel:  No obstruction. No appendicitis. Vascular/Lymphatic: No acute vascular abnormality. No mass or adenopathy. Reproductive:No pathologic findings. Other: No ascites or pneumoperitoneum. Musculoskeletal: No acute abnormalities. Spondylosis with multi-level bridging lower thoracic osteophytes. These results were called by telephone at the time of interpretation on 08/11/2020 at 4:24 am to provider [facility] , who verbally acknowledged these results. IMPRESSION: 1. Cystitis. 2. Inflated Foley catheter at the urethra. 3. Bladder and right renal calculi.  No hydronephrosis. Electronically Signed   By: Marnee Spring M.D.   On: 08/11/2020 04:24    Impression/Assessment:  65 year old male who is status post TURP in 2019 for BPH who presented to the ED with a febrile UTI and Foley catheter displacement.  Previous  to his ED visit, he was seen at [facility] ED and found to have pinpoint urethral stricture that was dilated at [facility] and Foley catheter was placed.  Foley balloon was found in patient's urethra today in the ED and has since been removed.  Hopefully, the urethral stricture has been temporarily addressed at this time with a catheter placement and then the catheter dislodgment with the balloon into the urethra.  It would be best for the patient if he could continue to void spontaneously with residuals under 300 cc to avoid placing another catheter as he likely has significant trauma to his urethra.  Plan:  -would not recommend Foley placement at this time as patient seems to be voiding spontaneously and PVR < 300 cc -Discontinue fesoterodine 4 mg daily as anticholinergics can contribute to urinary retention -Would recommend starting tamsulosin 0.4 mg to aid in bladder emptying -Continue to monitor urinary output and bladder scan if he should complain of suprapubic pain or decrease in urinary output -If PVR is found to be greater than 300 cc, urology will need to be  consulted for possible Foley catheter placement -He will need outpatient follow-up to address his stricture disease and lower urinary tract symptoms  08/11/2020, 12:26 PM  Michiel Cowboy, PA-C

## 2020-08-11 NOTE — Sepsis Progress Note (Signed)
Monitoring per Sepsis protocol 

## 2020-08-11 NOTE — Consult Note (Signed)
Pharmacy Antibiotic Note  Troy Juarez is a 65 y.o. male admitted on 08/11/2020 with sepsis and UTI. Sepsis 2/2 UTI c/b underlying urinary retention d/t stricture. Pharmacy has been consulted for Cefepime dosing.  Plan: Tmax 103>101.2 (on APAP/IBU), WBC 16.8>__; lactate 1.4 Cefepime 2g x1 in ED (7/06 ~0400); then start Cefepime 2g q8h (7/06 1200>>  Height: 5\' 8"  (172.7 cm) Weight: 84.1 kg (185 lb 8 oz) IBW/kg (Calculated) : 68.4  Temp (24hrs), Avg:101.4 F (38.6 C), Min:100 F (37.8 C), Max:103 F (39.4 C)  Recent Labs  Lab 08/10/20 2257 08/11/20 0320  WBC 16.8*  --   CREATININE 0.92  --   LATICACIDVEN  --  1.4    Estimated Creatinine Clearance: 85.7 mL/min (by C-G formula based on SCr of 0.92 mg/dL).    No Known Allergies  Antimicrobials this admission: + cefepime (7/06 >>   Dose adjustments this admission: N/A will CTM  Microbiology results: 7/05: UA nitrite(+), moderate WBC/Hgb, >300 protein. 7/06: Bcx / Ucx - pending  Thank you for allowing pharmacy to be a part of this patient's care.  9/06 Delta Deshmukh 08/11/2020 11:17 AM

## 2020-08-11 NOTE — ED Notes (Signed)
Pt currently experiencing urinary incontinence, pt was able to notify RN of soiled clothing. Pt cleaned and changed

## 2020-08-11 NOTE — ED Notes (Signed)
This EDT bladder scanned pt, resulting in 170 mL.

## 2020-08-11 NOTE — Progress Notes (Signed)
PHARMACY -  BRIEF ANTIBIOTIC NOTE   Pharmacy has received consult(s) for Cefepime from an ED provider.  The patient's profile has been reviewed for ht/wt/allergies/indication/available labs.    One time order(s) placed for Cefepime 2 gm IV X 1.   Further antibiotics/pharmacy consults should be ordered by admitting physician if indicated.                       Thank you, Torrance Frech D 08/11/2020  2:54 AM

## 2020-08-11 NOTE — ED Notes (Signed)
Pt has nausea  meds given iv.

## 2020-08-12 DIAGNOSIS — K219 Gastro-esophageal reflux disease without esophagitis: Secondary | ICD-10-CM

## 2020-08-12 DIAGNOSIS — N401 Enlarged prostate with lower urinary tract symptoms: Secondary | ICD-10-CM

## 2020-08-12 DIAGNOSIS — R3911 Hesitancy of micturition: Secondary | ICD-10-CM

## 2020-08-12 LAB — CBC
HCT: 36.3 % — ABNORMAL LOW (ref 39.0–52.0)
Hemoglobin: 12.2 g/dL — ABNORMAL LOW (ref 13.0–17.0)
MCH: 29.8 pg (ref 26.0–34.0)
MCHC: 33.6 g/dL (ref 30.0–36.0)
MCV: 88.8 fL (ref 80.0–100.0)
Platelets: 429 10*3/uL — ABNORMAL HIGH (ref 150–400)
RBC: 4.09 MIL/uL — ABNORMAL LOW (ref 4.22–5.81)
RDW: 15.6 % — ABNORMAL HIGH (ref 11.5–15.5)
WBC: 12.6 10*3/uL — ABNORMAL HIGH (ref 4.0–10.5)
nRBC: 0 % (ref 0.0–0.2)

## 2020-08-12 LAB — CORTISOL-AM, BLOOD: Cortisol - AM: 14 ug/dL (ref 6.7–22.6)

## 2020-08-12 LAB — BASIC METABOLIC PANEL
Anion gap: 7 (ref 5–15)
BUN: 17 mg/dL (ref 8–23)
CO2: 27 mmol/L (ref 22–32)
Calcium: 9.1 mg/dL (ref 8.9–10.3)
Chloride: 103 mmol/L (ref 98–111)
Creatinine, Ser: 0.88 mg/dL (ref 0.61–1.24)
GFR, Estimated: >60 mL/min (ref >60)
Glucose, Bld: 121 mg/dL — ABNORMAL HIGH (ref 70–99)
Potassium: 4 mmol/L (ref 3.5–5.1)
Sodium: 137 mmol/L (ref 135–145)

## 2020-08-12 LAB — PROTIME-INR
INR: 1.1 (ref 0.8–1.2)
Prothrombin Time: 14.3 seconds (ref 11.4–15.2)

## 2020-08-12 LAB — PROCALCITONIN: Procalcitonin: 0.52 ng/mL

## 2020-08-12 MED ORDER — TAMSULOSIN HCL 0.4 MG PO CAPS
0.4000 mg | ORAL_CAPSULE | Freq: Every day | ORAL | Status: DC
  Administered 2020-08-12: 0.4 mg via ORAL
  Filled 2020-08-12: qty 1

## 2020-08-12 NOTE — Progress Notes (Addendum)
1       [place] at [facility]   PATIENT NAME: Troy Juarez    MR#:  [id number]  PCP: Sandrea Hughs, NP  DATE OF BIRTH:  1955-04-14  SUBJECTIVE:  CHIEF COMPLAINT:   Chief Complaint  Patient presents with   Back Pain  Reports lot of pressure when he has to pee but is able to empty his bladder and then having dribbling urine at night.  Had Spanish interpreter with computer at bedside for communication REVIEW OF SYSTEMS:  Review of Systems  Constitutional:  Negative for diaphoresis, fever, malaise/fatigue and weight loss.  HENT:  Negative for ear discharge, ear pain, hearing loss, nosebleeds, sore throat and tinnitus.   Eyes:  Negative for blurred vision and pain.  Respiratory:  Negative for cough, hemoptysis, shortness of breath and wheezing.   Cardiovascular:  Negative for chest pain, palpitations, orthopnea and leg swelling.  Gastrointestinal:  Negative for abdominal pain, blood in stool, constipation, diarrhea, heartburn, nausea and vomiting.  Genitourinary:  Positive for dysuria and frequency. Negative for urgency.  Musculoskeletal:  Negative for back pain and myalgias.  Skin:  Negative for itching and rash.  Neurological:  Negative for dizziness, tingling, tremors, focal weakness, seizures, weakness and headaches.  Psychiatric/Behavioral:  Negative for depression. The patient is not nervous/anxious.   DRUG ALLERGIES:  No Known Allergies VITALS:  Blood pressure 133/81, pulse (!) 105, temperature 99.3 F (37.4 C), temperature source Oral, resp. rate 18, height 5\' 5"  (1.651 m), weight 77.2 kg, SpO2 94 %. PHYSICAL EXAMINATION:  Physical Exam 65 year old male lying in the bed comfortably without any acute distress Eyes pupils equal round reactive to light recommendation no scleral icterus Lungs clear to auscultation bilaterally no wheezing rales rhonchi crepitation Cardiovascular S1-S2 normal no murmur rales or gallop Abdomen soft, benign Neuro alert and  oriented, nonfocal Skin no rash or lesion LABORATORY PANEL:  Male CBC Recent Labs  Lab 08/12/20 0531  WBC 12.6*  HGB 12.2*  HCT 36.3*  PLT 429*   ------------------------------------------------------------------------------------------------------------------ Chemistries  Recent Labs  Lab 08/12/20 0531  NA 137  K 4.0  CL 103  CO2 27  GLUCOSE 121*  BUN 17  CREATININE 0.88  CALCIUM 9.1   RADIOLOGY:  No results found. ASSESSMENT AND PLAN:  65 y.o. male with medical history significant for GERD, anxiety, arthritis, urinary retention status post Foley catheter insertion admitted for back pain as well as pain and burning sensation around the penile area.  Principal Problem:   Sepsis secondary to UTI [facility]) Active Problems:   BPH (benign prostatic hyperplasia)   GERD (gastroesophageal reflux disease)  Sepsis present on admission secondary to UTI As evidenced by fever with a T-max of 103, tachycardia, leukocytosis and pyuria. 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 daily.  Monitor urine output.  If PVR greater than 300 cc urology may replace Foley catheter -Outpatient urology follow-up to address stricture disease.  Patient has outpatient follow-up with Dr. 08/02/20 at [facility]  Body mass index is 28.32 kg/m.  Net IO Since Admission: 2,677.5 mL [08/12/20 1547]      Status is: Inpatient  Remains inpatient appropriate because:Ongoing active pain  requiring inpatient pain management  Dispo: The patient is from: Home              Anticipated d/c is to: Home              Patient currently is not medically stable to d/c.   Difficult to place patient No    DVT prophylaxis:       SCDs Start: 08/11/20 2542     Family Communication:  "discussed with patient")   All the records are reviewed and case discussed with Care Management/Social Worker. Management plans discussed with the patient, nursing and they are in agreement.  CODE STATUS: Full Code Level of care: Med-Surg  TOTAL TIME TAKING CARE OF THIS PATIENT: 35 minutes.   More than 50% of the time was spent in counseling/coordination of care: YES  POSSIBLE D/C IN 1-2 DAYS, DEPENDING ON CLINICAL CONDITION.   Delfino Lovett M.D on 08/12/2020 at 3:47 PM  [facility]   CC: Primary care physician; Sandrea Hughs, NP  Note: This dictation was prepared with Dragon dictation along with smaller phrase technology. Any transcriptional errors that result from this process are unintentional.

## 2020-08-12 NOTE — Plan of Care (Signed)

## 2020-08-13 DIAGNOSIS — N12 Tubulo-interstitial nephritis, not specified as acute or chronic: Secondary | ICD-10-CM

## 2020-08-13 LAB — BASIC METABOLIC PANEL
Anion gap: 9 (ref 5–15)
BUN: 17 mg/dL (ref 8–23)
CO2: 25 mmol/L (ref 22–32)
Calcium: 8.6 mg/dL — ABNORMAL LOW (ref 8.9–10.3)
Chloride: 102 mmol/L (ref 98–111)
Creatinine, Ser: 0.78 mg/dL (ref 0.61–1.24)
GFR, Estimated: >60 mL/min (ref >60)
Glucose, Bld: 122 mg/dL — ABNORMAL HIGH (ref 70–99)
Potassium: 3.5 mmol/L (ref 3.5–5.1)
Sodium: 136 mmol/L (ref 135–145)

## 2020-08-13 LAB — CBC
HCT: 34.3 % — ABNORMAL LOW (ref 39.0–52.0)
Hemoglobin: 11.8 g/dL — ABNORMAL LOW (ref 13.0–17.0)
MCH: 30.5 pg (ref 26.0–34.0)
MCHC: 34.4 g/dL (ref 30.0–36.0)
MCV: 88.6 fL (ref 80.0–100.0)
Platelets: 398 10*3/uL (ref 150–400)
RBC: 3.87 MIL/uL — ABNORMAL LOW (ref 4.22–5.81)
RDW: 15.4 % (ref 11.5–15.5)
WBC: 9 10*3/uL (ref 4.0–10.5)
nRBC: 0 % (ref 0.0–0.2)

## 2020-08-13 MED ORDER — SULFAMETHOXAZOLE-TRIMETHOPRIM 800-160 MG PO TABS: 1.0000 | ORAL_TABLET | Freq: Two times a day (BID) | ORAL | 0 refills | Status: AC

## 2020-08-13 MED ORDER — TAMSULOSIN HCL 0.4 MG PO CAPS: 0.4000 mg | ORAL_CAPSULE | Freq: Every day | ORAL | 0 refills | Status: DC

## 2020-08-13 NOTE — Plan of Care (Signed)

## 2020-08-13 NOTE — Plan of Care (Signed)
  Problem: Education: Goal: Knowledge of General Education information will improve Description: Including pain rating scale, medication(s)/side effects and non-pharmacologic comfort measures 08/13/2020 1252 by Gerarda Gunther, RN Outcome: Adequate for Discharge 08/13/2020 0824 by Gerarda Gunther, RN Outcome: Progressing   Problem: Health Behavior/Discharge Planning: Goal: Ability to manage health-related needs will improve 08/13/2020 1252 by Gerarda Gunther, RN Outcome: Adequate for Discharge 08/13/2020 0824 by Gerarda Gunther, RN Outcome: Progressing   Problem: Clinical Measurements: Goal: Ability to maintain clinical measurements within normal limits will improve 08/13/2020 1252 by Gerarda Gunther, RN Outcome: Adequate for Discharge 08/13/2020 0824 by Gerarda Gunther, RN Outcome: Progressing Goal: Will remain free from infection 08/13/2020 1252 by Gerarda Gunther, RN Outcome: Adequate for Discharge 08/13/2020 0824 by Gerarda Gunther, RN Outcome: Progressing Goal: Diagnostic test results will improve 08/13/2020 1252 by Gerarda Gunther, RN Outcome: Adequate for Discharge 08/13/2020 0824 by Gerarda Gunther, RN Outcome: Progressing Goal: Respiratory complications will improve 08/13/2020 1252 by Gerarda Gunther, RN Outcome: Adequate for Discharge 08/13/2020 0824 by Gerarda Gunther, RN Outcome: Progressing Goal: Cardiovascular complication will be avoided 08/13/2020 1252 by Gerarda Gunther, RN Outcome: Adequate for Discharge 08/13/2020 0824 by Gerarda Gunther, RN Outcome: Progressing   Problem: Activity: Goal: Risk for activity intolerance will decrease 08/13/2020 1252 by Gerarda Gunther, RN Outcome: Adequate for Discharge 08/13/2020 0824 by Gerarda Gunther, RN Outcome: Progressing   Problem: Nutrition: Goal: Adequate nutrition will be maintained 08/13/2020 1252 by Gerarda Gunther, RN Outcome: Adequate for Discharge 08/13/2020 0824 by Gerarda Gunther, RN Outcome: Progressing   Problem: Coping: Goal: Level of anxiety  will decrease 08/13/2020 1252 by Gerarda Gunther, RN Outcome: Adequate for Discharge 08/13/2020 0824 by Gerarda Gunther, RN Outcome: Progressing   Problem: Elimination: Goal: Will not experience complications related to bowel motility 08/13/2020 1252 by Gerarda Gunther, RN Outcome: Adequate for Discharge 08/13/2020 0824 by Gerarda Gunther, RN Outcome: Progressing Goal: Will not experience complications related to urinary retention 08/13/2020 1252 by Gerarda Gunther, RN Outcome: Adequate for Discharge 08/13/2020 0824 by Gerarda Gunther, RN Outcome: Progressing   Problem: Pain Managment: Goal: General experience of comfort will improve 08/13/2020 1252 by Gerarda Gunther, RN Outcome: Adequate for Discharge 08/13/2020 0824 by Gerarda Gunther, RN Outcome: Progressing   Problem: Safety: Goal: Ability to remain free from injury will improve 08/13/2020 1252 by Gerarda Gunther, RN Outcome: Adequate for Discharge 08/13/2020 0824 by Gerarda Gunther, RN Outcome: Progressing   Problem: Skin Integrity: Goal: Risk for impaired skin integrity will decrease 08/13/2020 1252 by Gerarda Gunther, RN Outcome: Adequate for Discharge 08/13/2020 0824 by Gerarda Gunther, RN Outcome: Progressing

## 2020-08-14 LAB — URINE CULTURE: Culture: >=100000 — AB

## 2020-08-16 LAB — CULTURE, BLOOD (ROUTINE X 2)
Culture: NO GROWTH
Culture: NO GROWTH
Special Requests: ADEQUATE
Special Requests: ADEQUATE

## 2020-08-20 NOTE — Discharge Summary (Signed)
[place] at [facility]   PATIENT NAME: Troy Juarez    MR#:  [id number]  DATE OF BIRTH:  1955-07-06  DATE OF ADMISSION:  08/11/2020   ADMITTING PHYSICIAN: Lucile Shutters, MD  DATE OF DISCHARGE: 08/13/2020 12:51 PM  PRIMARY CARE PHYSICIAN: Sandrea Hughs, NP   ADMISSION DIAGNOSIS:  Pyelonephritis [N12] Sepsis secondary to UTI (HCC) [A41.9, N39.0] Sepsis due to gram-negative UTI (HCC) [A41.50, N39.0] Sepsis without acute organ dysfunction, due to unspecified organism (HCC) [A41.9] DISCHARGE DIAGNOSIS:  Principal Problem:   Sepsis without acute organ dysfunction (HCC) Active Problems:   BPH (benign prostatic hyperplasia)   GERD (gastroesophageal reflux disease)   Pyelonephritis  SECONDARY DIAGNOSIS:   Past Medical History:  Diagnosis Date   Anxiety    Arthritis    GERD (gastroesophageal reflux disease)    Pre-diabetes    HOSPITAL COURSE:  65 y.o. male with medical history significant for GERD, anxiety, arthritis, urinary retention status post Foley catheter insertion admitted for back pain as well as pain and burning sensation around the penile area.   Sepsis present on admission secondary to UTI As evidenced by fever with a T-max of 103, tachycardia, leukocytosis and pyuria. Sepsis resolved with aggressive treatment and IV antibiotics. 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 Throughout hospitalization, PVR remained < 300 cc Started and D/Ced on Flomax 0.4 mg p.o. daily. -Outpatient urology follow-up to address stricture disease.  Patient has outpatient follow-up with Dr. Guy Sandifer at [facility]. He was asked to f/up with  [facility] if he chooses to follow locally and # was provided.  DISCHARGE CONDITIONS:  stable CONSULTS OBTAINED:  Treatment Team:  Vanna Scotland, MD DRUG ALLERGIES:  No Known Allergies DISCHARGE MEDICATIONS:   Allergies as of 08/13/2020   No Known Allergies      Medication List     TAKE these medications    omeprazole 20 MG capsule Commonly known as: PRILOSEC Take 20-40 mg by mouth See admin instructions. Take 1 to 2 capsules (20mg -40mg ) by mouth up to twice daily   solifenacin 10 MG tablet Commonly known as: VESICARE Take 1 tablet (10 mg total) by mouth daily.   tamsulosin 0.4 MG Caps capsule Commonly known as: FLOMAX Take 1 capsule (0.4 mg total) by mouth daily after supper.       ASK your doctor about these medications    sulfamethoxazole-trimethoprim 800-160 MG tablet Commonly known as: BACTRIM DS Take 1 tablet by mouth 2 (two) times daily for 5 days. Ask about: Should I take this medication?       DISCHARGE INSTRUCTIONS:   DIET:  Regular diet DISCHARGE CONDITION:  Good ACTIVITY:  Activity as tolerated OXYGEN:  Home Oxygen: No.  Oxygen Delivery: room air DISCHARGE LOCATION:  home   If you experience worsening of your admission symptoms, develop shortness of breath, life threatening emergency, suicidal or homicidal thoughts you must seek medical attention immediately by calling 911 or calling your MD immediately  if symptoms less severe.  You Must read complete instructions/literature along with all the possible adverse reactions/side effects for all the Medicines you take and that have been prescribed to you. Take any new  Medicines after you have completely understood and accpet all the possible adverse reactions/side effects.   Please note  You were cared for by a hospitalist during your hospital stay. If you have any questions about your discharge medications or the care you received while you were in the hospital after you are  discharged, you can call the unit and asked to speak with the hospitalist on call if the hospitalist that took care of you is not available. Once you are discharged, your primary care physician will handle any further medical issues. Please note that NO REFILLS for any discharge medications will be authorized once you are discharged, as it is imperative that you return to your primary care physician (or establish a relationship with a primary care physician if you do not have one) for your aftercare needs so that they can reassess your need for medications and monitor your lab values.    On the day of Discharge:  VITAL SIGNS:  Blood pressure 120/80, pulse 99, temperature 99 F (37.2 C), temperature source Oral, resp. rate 16, height 5\' 5"  (1.651 m), weight 77.2 kg, SpO2 95 %. PHYSICAL EXAMINATION:  GENERAL:  65 y.o.-year-old patient lying in the bed with no acute distress.  EYES: Pupils equal, round, reactive to light and accommodation. No scleral icterus. Extraocular muscles intact.  HEENT: Head atraumatic, normocephalic. Oropharynx and nasopharynx clear.  NECK:  Supple, no jugular venous distention. No thyroid enlargement, no tenderness.  LUNGS: Normal breath sounds bilaterally, no wheezing, rales,rhonchi or crepitation. No use of accessory muscles of respiration.  CARDIOVASCULAR: S1, S2 normal. No murmurs, rubs, or gallops.  ABDOMEN: Soft, non-tender, non-distended. Bowel sounds present. No organomegaly or mass.  EXTREMITIES: No pedal edema, cyanosis, or clubbing.  NEUROLOGIC: Cranial nerves II through XII are intact. Muscle strength 5/5 in all extremities. Sensation intact. Gait not checked.  PSYCHIATRIC: The patient is alert and oriented x 3.  SKIN: No obvious rash, lesion, or ulcer.  DATA REVIEW:   CBC No results for input(s): WBC, HGB, HCT, PLT in the last 168 hours.  Chemistries  No results for input(s): NA, K, CL, CO2, GLUCOSE, BUN, CREATININE, CALCIUM, MG, AST, ALT, ALKPHOS,  BILITOT in the last 168 hours.  Invalid input(s): GFRCGP   Outpatient follow-up  Follow-up Information     76 A, PA-C. Schedule an appointment as soon as possible for a visit on 08/26/2020.   Specialty: Urology Why: [facility] Discharge F/UP - F/U appt 07/21 @ 10am w/Dr. 8/21 information: [address] [place] [id number]         [phone], NP. Schedule an appointment as soon as possible for a visit in 1 week(s).   Specialty: Nurse Practitioner Why: [facility] Discharge F/UP Contact information: [address] [place] [phone]                 30 Day Unplanned Readmission Risk Score    Flowsheet Row ED to Hosp-Admission (Discharged) from 08/11/2020 in [facility]  30 Day Unplanned Readmission Risk Score (%) 9.17 Filed at 08/13/2020 1200       This score is the patient's risk of an unplanned readmission within 30 days of being discharged (0 -100%). The score is based on dignosis, age, lab data, medications, orders, and past utilization.   Low:  0-14.9   Medium: 15-21.9   High: 22-29.9   Extreme: 30 and above  Management plans discussed with the patient, family and they are in agreement.  CODE STATUS: Prior   TOTAL TIME TAKING CARE OF THIS PATIENT: 45 minutes.    Delfino Lovett M.D on 08/20/2020 at 5:51 PM  [facility]   CC: Primary care physician; Sandrea Hughs, NP   Note: This dictation was prepared with Dragon dictation along with smaller phrase technology. Any transcriptional errors that result from this process are unintentional.

## 2020-08-26 ENCOUNTER — Ambulatory Visit (INDEPENDENT_AMBULATORY_CARE_PROVIDER_SITE_OTHER): Payer: Medicare Other | Admitting: Urology

## 2020-08-26 ENCOUNTER — Other Ambulatory Visit: Payer: Self-pay

## 2020-08-26 ENCOUNTER — Encounter: Payer: Self-pay | Admitting: Urology

## 2020-08-26 VITALS — BP 130/75 | HR 67 | Ht 65.0 in | Wt 176.0 lb

## 2020-08-26 DIAGNOSIS — R3911 Hesitancy of micturition: Secondary | ICD-10-CM

## 2020-08-26 DIAGNOSIS — N401 Enlarged prostate with lower urinary tract symptoms: Secondary | ICD-10-CM | POA: Diagnosis not present

## 2020-08-26 DIAGNOSIS — N390 Urinary tract infection, site not specified: Secondary | ICD-10-CM | POA: Diagnosis not present

## 2020-08-26 LAB — BLADDER SCAN AMB NON-IMAGING

## 2020-08-26 MED ORDER — SULFAMETHOXAZOLE-TRIMETHOPRIM 800-160 MG PO TABS: 1.0000 | ORAL_TABLET | Freq: Two times a day (BID) | ORAL | 0 refills | Status: AC

## 2020-08-26 MED ORDER — TAMSULOSIN HCL 0.4 MG PO CAPS: 0.4000 mg | ORAL_CAPSULE | Freq: Every day | ORAL | 11 refills | Status: AC

## 2020-08-26 NOTE — Progress Notes (Signed)
   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 Dipiero in clinic today for follow-up of the above issues.  He has a complex urologic history.  I reviewed the notes and imaging extensively from recent ER visit with urology consult at [facility], as well as recent admission to [facility].  Briefly, he underwent a TURP with me in October 2019 and was emptying well afterwards with PVRs less than 100 mL and strong urinary stream, but persistent bothersome urgency and frequency, and nocturia.  He was trialed on anticholinergics, but he did not follow-up.  I last saw him in December 2019.  He reports about 6 months of weakening urinary stream, ultimately resulting in an ER visit to [facility] on 07/31/2020.  It sounds like multiple catheters were attempted at that visit and met resistance, and ultimately a catheter was scoped and at the bedside by urology.  This reportedly showed a pinpoint bladder neck contracture that was dilated with [facility] dilators over a wire, but difficult to interpret in the setting of multiple traumatic catheter attempts.  He then presented to [facility] ED on 08/11/2020 with back pain and fevers, and CT at that time showed malpositioning of the catheter with the balloon in the proximal urethra, and urine culture grew Proteus.  His catheter was removed, and he was voiding spontaneously with low PVRs and the catheter was not replaced, and he was set up for follow-up with me.  I personally viewed and interpreted the CT dated 08/11/2020 that shows the Foley balloon inflated in the proximal urethra, and prostate volume of 37g.  He has overall been doing okay since that visit.  PVR is normal again today at 17 mL.  His primary urinary complaint is some dysuria and urgency/frequency.  He was unable to void for urinalysis today.  I recommended Bactrim DS twice daily x7 days for possible  persistent infection, with close follow-up in 1 week for cystoscopy to evaluate for bladder neck contracture/prostatic anatomy.  We discussed possible need for future treatments   I spent 45 total minutes on the day of the encounter including pre-visit review of the medical record, face-to-face time with the patient, and post visit ordering of labs/imaging/tests.  Billey Co, [facility] [address], [place], [place] [address] [phone]

## 2020-08-26 NOTE — Patient Instructions (Signed)
Cystoscopy Cystoscopy is a procedure that is used to help diagnose and sometimes treat conditions that affect the lower urinary tract. The lower urinary tract includes the bladder and the urethra. The urethra is the tube that drains urine from the bladder. Cystoscopy is done using a thin, tube-shaped instrument with a light and camera at the end (cystoscope). The cystoscope may be hard or flexible, depending on the goal of the procedure. The cystoscope is inserted through the urethra, into the bladder. Cystoscopy may be recommended if you have: Urinary tract infections that keep coming back. Blood in the urine (hematuria). An inability to control when you urinate (urinary incontinence) or an overactive bladder. Unusual cells found in a urine sample. A blockage in the urethra, such as a urinary stone. Painful urination. An abnormality in the bladder found during an intravenous pyelogram (IVP) or CT scan. Cystoscopy may also be done to remove a sample of tissue to be examined under a microscope (biopsy). What are the risks? Generally, this is a safe procedure. However, problems may occur, including: Infection. Bleeding.  What happens during the procedure?  You will be given one or more of the following: A medicine to numb the area (local anesthetic). The area around the opening of your urethra will be cleaned. The cystoscope will be passed through your urethra into your bladder. Germ-free (sterile) fluid will flow through the cystoscope to fill your bladder. The fluid will stretch your bladder so that your health care provider can clearly examine your bladder walls. Your doctor will look at the urethra and bladder. The cystoscope will be removed The procedure may vary among health care providers  What can I expect after the procedure? After the procedure, it is common to have: Some soreness or pain in your abdomen and urethra. Urinary symptoms. These include: Mild pain or burning when you  urinate. Pain should stop within a few minutes after you urinate. This may last for up to 1 week. A small amount of blood in your urine for several days. Feeling like you need to urinate but producing only a small amount of urine. Follow these instructions at home: General instructions Return to your normal activities as told by your health care provider.  Do not drive for 24 hours if you were given a sedative during your procedure. Watch for any blood in your urine. If the amount of blood in your urine increases, call your health care provider. If a tissue sample was removed for testing (biopsy) during your procedure, it is up to you to get your test results. Ask your health care provider, or the department that is doing the test, when your results will be ready. Drink enough fluid to keep your urine pale yellow. Keep all follow-up visits as told by your health care provider. This is important. Contact a health care provider if you: Have pain that gets worse or does not get better with medicine, especially pain when you urinate. Have trouble urinating. Have more blood in your urine. Get help right away if you: Have blood clots in your urine. Have abdominal pain. Have a fever or chills. Are unable to urinate. Summary Cystoscopy is a procedure that is used to help diagnose and sometimes treat conditions that affect the lower urinary tract. Cystoscopy is done using a thin, tube-shaped instrument with a light and camera at the end. After the procedure, it is common to have some soreness or pain in your abdomen and urethra. Watch for any blood in your urine.   If the amount of blood in your urine increases, call your health care provider. If you were prescribed an antibiotic medicine, take it as told by your health care provider. Do not stop taking the antibiotic even if you start to feel better. This information is not intended to replace advice given to you by your health care provider. Make  sure you discuss any questions you have with your health care provider. Document Revised: 01/15/2018 Document Reviewed: 01/15/2018 Elsevier Patient Education  2020 [employer]vier Inc[employer]  

## 2020-09-09 ENCOUNTER — Encounter: Payer: Self-pay | Admitting: Urology

## 2020-09-09 ENCOUNTER — Other Ambulatory Visit: Payer: Self-pay | Admitting: Urology

## 2022-05-09 DIAGNOSIS — L820 Inflamed seborrheic keratosis: Secondary | ICD-10-CM | POA: Diagnosis not present

## 2022-11-20 DIAGNOSIS — Z23 Encounter for immunization: Secondary | ICD-10-CM | POA: Diagnosis not present

## 2023-02-28 IMAGING — CT CT RENAL STONE PROTOCOL
2 of 4 series · 16 of 46 positions shown, 18 images · non-contrast
Comparison: None.

CLINICAL DATA: Back pain and sepsis. Evaluate for pyelonephritis or
urinary obstruction

EXAM:
CT ABDOMEN AND PELVIS WITHOUT CONTRAST
TECHNIQUE: Multidetector CT imaging of the abdomen and pelvis was performed
following the standard protocol without IV contrast.

[Series 2: stone full standard · axial · 0.85mm/px · z∈[-1220,-740]mm · 13 of 106 slices shown, 15 images]
[im 5/106  soft-tissue]
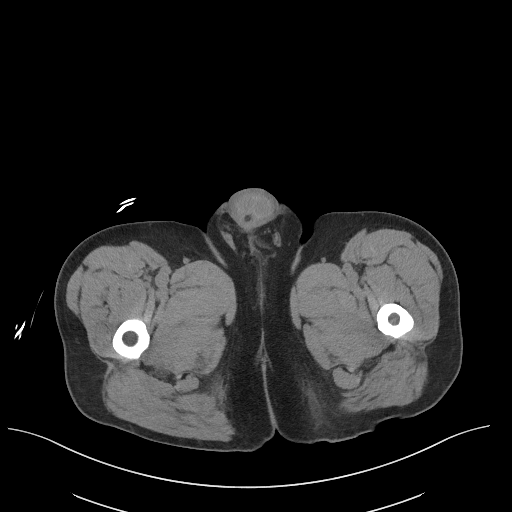
[im 5/106  bone]
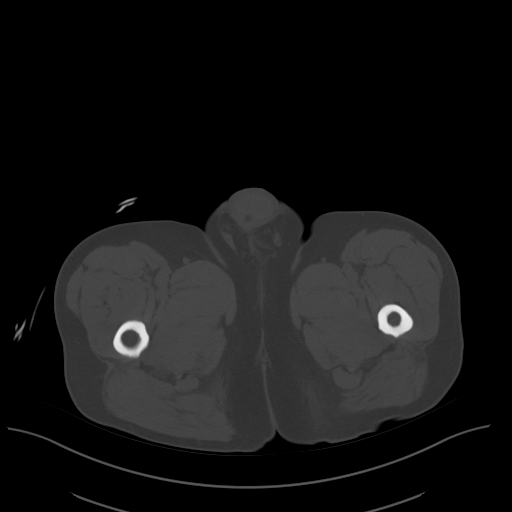
[im 14/106  soft-tissue]
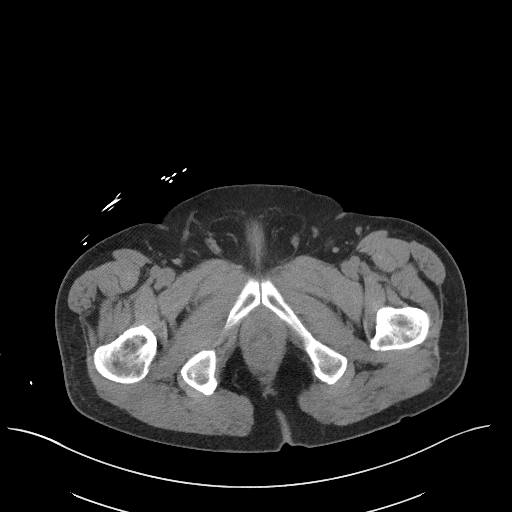
[im 23/106  soft-tissue]
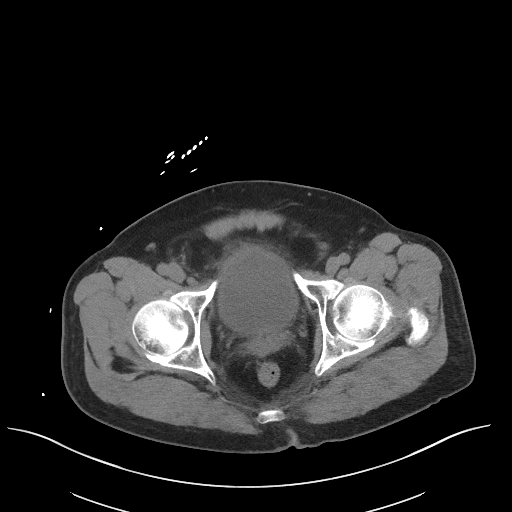
[im 28/106  soft-tissue]
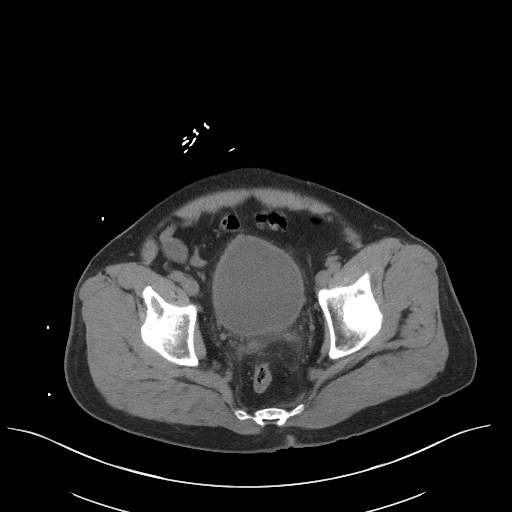
[im 37/106  soft-tissue]
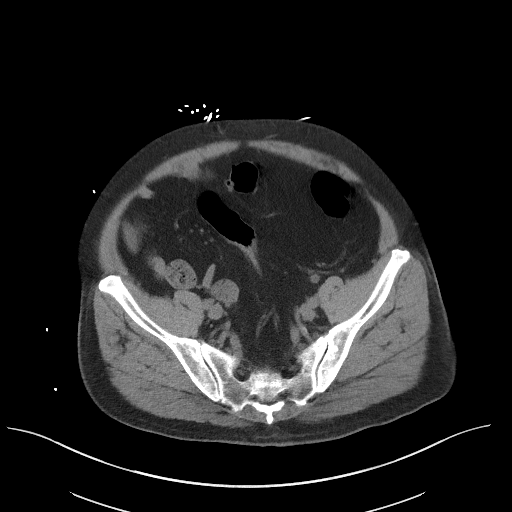
[im 46/106  soft-tissue]
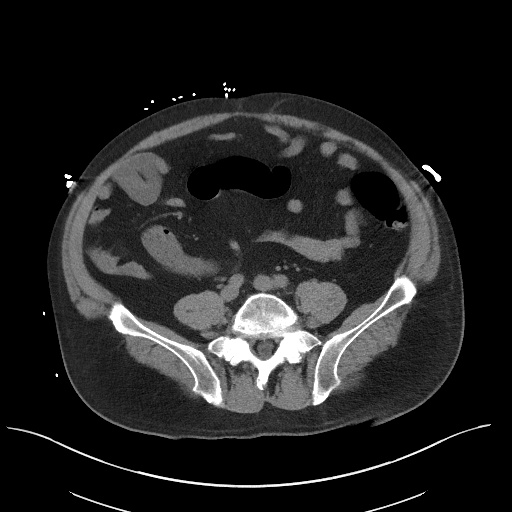
[im 55/106  soft-tissue]
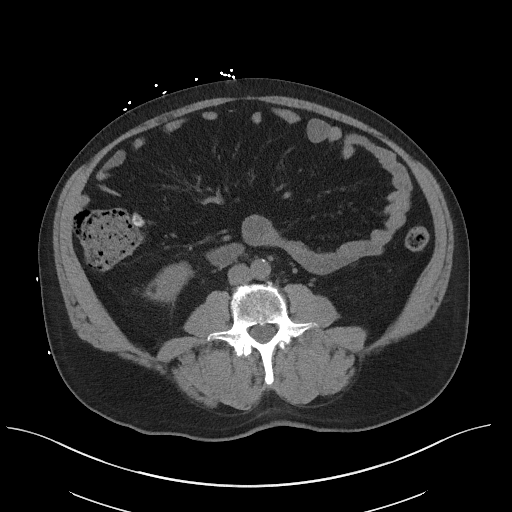
[im 60/106  soft-tissue]
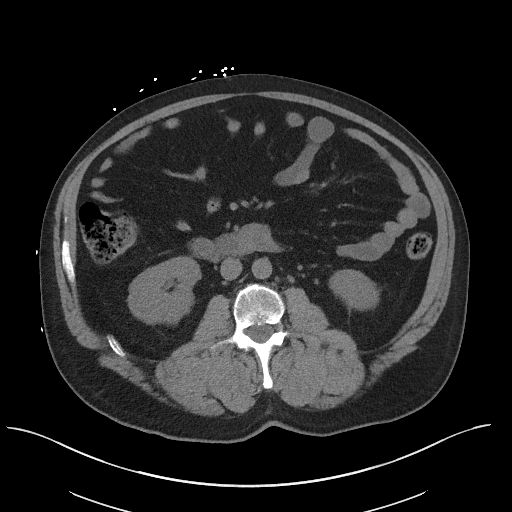
[im 69/106  soft-tissue]
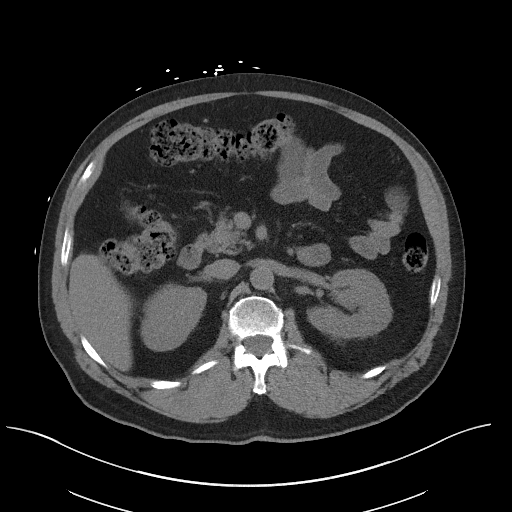
[im 69/106  bone]
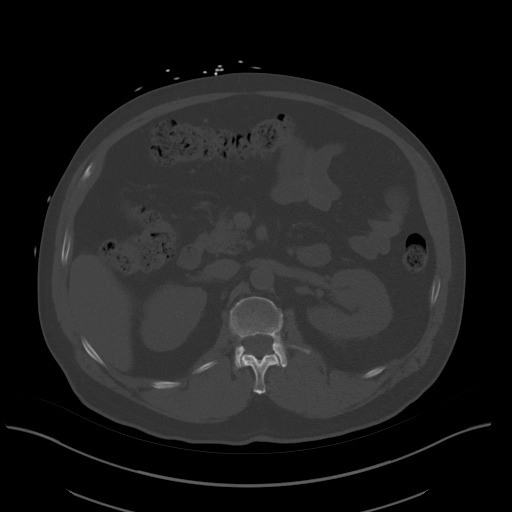
[im 78/106  soft-tissue]
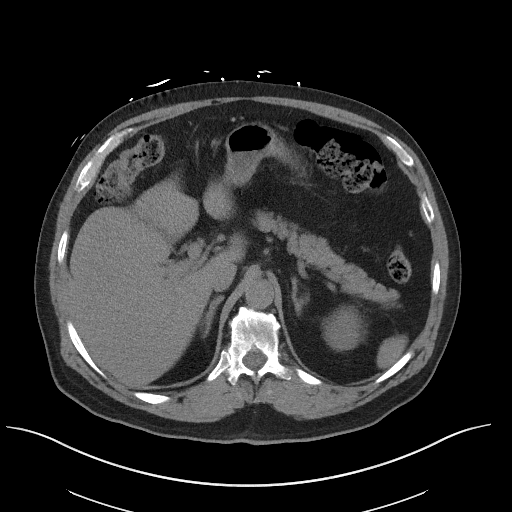
[im 83/106  soft-tissue]
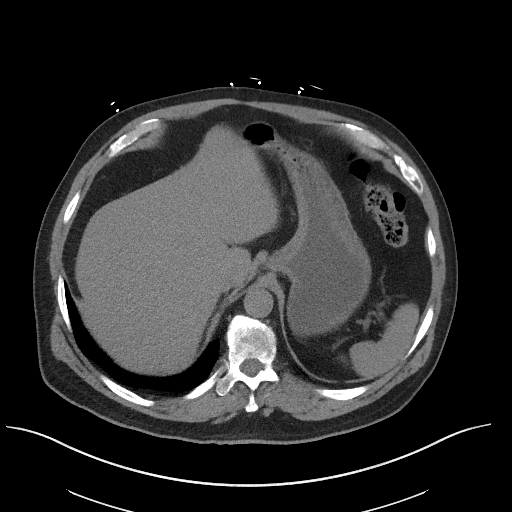
[im 92/106  soft-tissue]
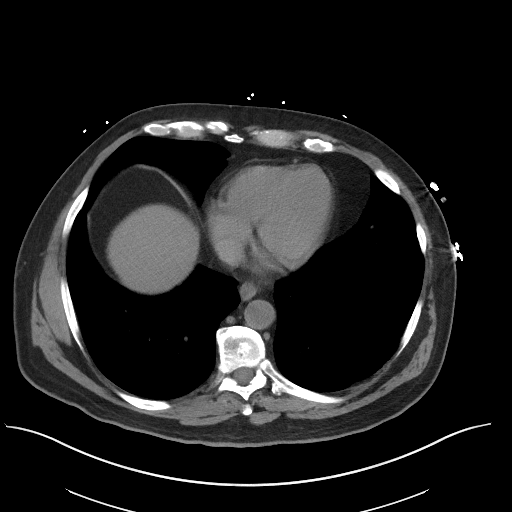
[im 101/106  soft-tissue]
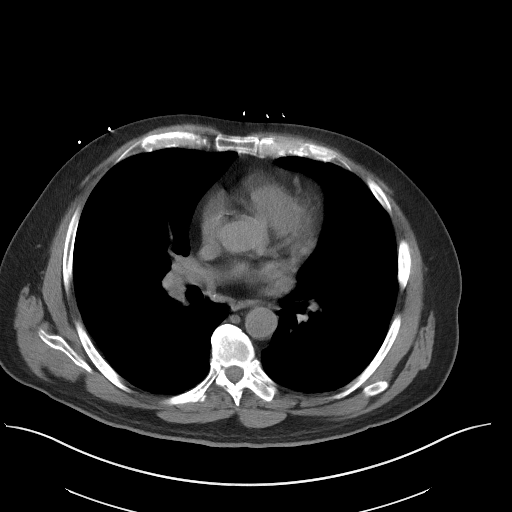

[Series 5: coronal · coronal · 0.79mm/px · 3 of 165 slices shown]
[im 55/165  soft-tissue]
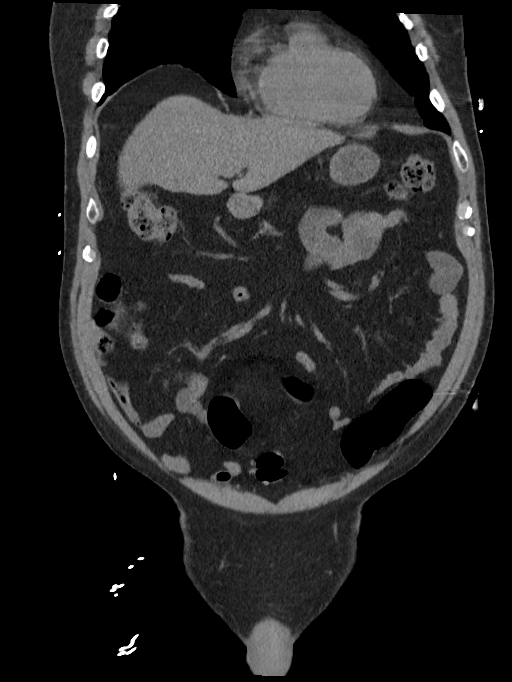
[im 73/165  soft-tissue]
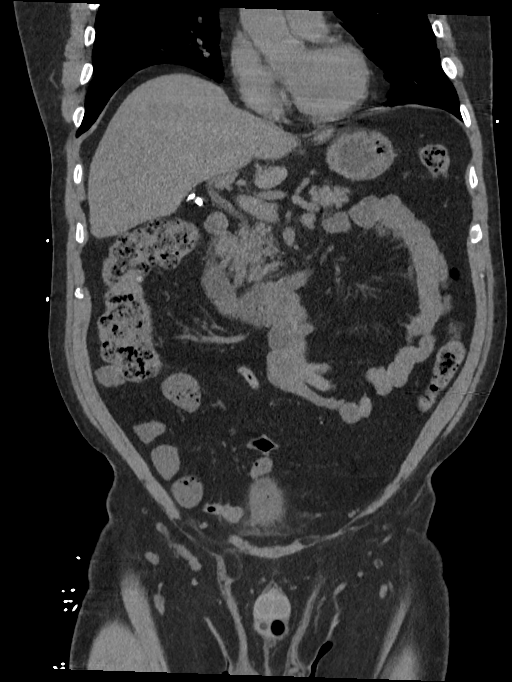
[im 92/165  soft-tissue]
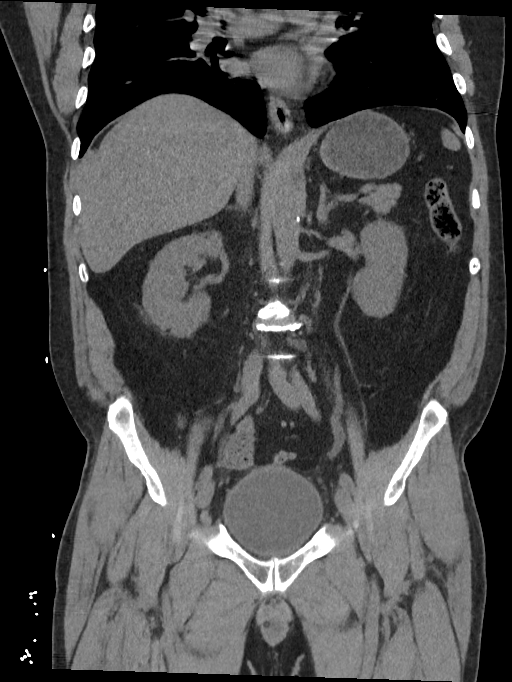

[16 of 46 positions shown; findings below may reference images not displayed]

FINDINGS: Lower chest:  No contributory findings.

Hepatobiliary: No focal liver abnormality.Cholecystectomy

Pancreas: Unremarkable.

Spleen: Unremarkable.

Adrenals/Urinary Tract: Negative adrenals. Extensive perivesicular
stranding. No asymmetric renal enlargement or stranding. No
hydronephrosis. There is a 4 mm right renal calculus which is
nonobstructive. The left ureter appears to insert eccentrically on
the bladder. Layering calcification in the urinary bladder. A Foley
catheter is inflated in the posterior urethra. Small left renal
cystic density.

Stomach/Bowel:  No obstruction. No appendicitis.

Vascular/Lymphatic: No acute vascular abnormality. No mass or
adenopathy.

Reproductive:No pathologic findings.

Other: No ascites or pneumoperitoneum.

Musculoskeletal: No acute abnormalities. Spondylosis with
multi-level bridging lower thoracic osteophytes.

These results were called by telephone at the time of interpretation
on 08/11/2020 at [DATE] to provider HANNIBAL FLANAGAN , who verbally
acknowledged these results.
IMPRESSION: 1. Cystitis.
2. Inflated Foley catheter at the urethra.
3. Bladder and right renal calculi.  No hydronephrosis.
